# Patient Record
Sex: Male | Born: 1975 | Race: White | Hispanic: No | Marital: Single | Smoking: Current every day smoker
Health system: Southern US, Community
[De-identification: ages and names within clinical notes are randomized; demographics above are authoritative.]

## PROBLEM LIST (undated history)

## (undated) DIAGNOSIS — E785 Hyperlipidemia, unspecified: Secondary | ICD-10-CM

## (undated) DIAGNOSIS — M549 Dorsalgia, unspecified: Secondary | ICD-10-CM

## (undated) DIAGNOSIS — M5430 Sciatica, unspecified side: Secondary | ICD-10-CM

## (undated) DIAGNOSIS — E119 Type 2 diabetes mellitus without complications: Secondary | ICD-10-CM

## (undated) DIAGNOSIS — M1990 Unspecified osteoarthritis, unspecified site: Secondary | ICD-10-CM

## (undated) DIAGNOSIS — R12 Heartburn: Secondary | ICD-10-CM

## (undated) HISTORY — PX: KNEE SURGERY: SHX244

## (undated) HISTORY — PX: FOOT SURGERY: SHX648

## (undated) HISTORY — PX: BACK SURGERY: SHX140

## (undated) HISTORY — PX: CHOLECYSTECTOMY: SHX55

---

## 2000-01-10 ENCOUNTER — Emergency Department (HOSPITAL_COMMUNITY): Admission: EM | Admit: 2000-01-10 | Discharge: 2000-01-10 | Payer: Self-pay | Admitting: Emergency Medicine

## 2000-09-21 ENCOUNTER — Encounter: Admission: RE | Admit: 2000-09-21 | Discharge: 2000-09-21 | Payer: Self-pay | Admitting: Family Medicine

## 2000-09-21 ENCOUNTER — Encounter: Payer: Self-pay | Admitting: Family Medicine

## 2001-01-04 ENCOUNTER — Emergency Department (HOSPITAL_COMMUNITY): Admission: EM | Admit: 2001-01-04 | Discharge: 2001-01-04 | Payer: Self-pay | Admitting: Emergency Medicine

## 2001-10-06 ENCOUNTER — Encounter: Admission: RE | Admit: 2001-10-06 | Discharge: 2001-11-01 | Payer: Self-pay | Admitting: Podiatry

## 2001-11-26 ENCOUNTER — Ambulatory Visit (HOSPITAL_COMMUNITY): Admission: RE | Admit: 2001-11-26 | Discharge: 2001-11-26 | Payer: Self-pay | Admitting: Orthopedic Surgery

## 2001-11-26 ENCOUNTER — Encounter: Payer: Self-pay | Admitting: Orthopedic Surgery

## 2001-12-21 ENCOUNTER — Encounter: Payer: Self-pay | Admitting: Orthopedic Surgery

## 2001-12-22 ENCOUNTER — Inpatient Hospital Stay (HOSPITAL_COMMUNITY): Admission: EM | Admit: 2001-12-22 | Discharge: 2001-12-23 | Payer: Self-pay | Admitting: Orthopedic Surgery

## 2002-03-16 ENCOUNTER — Emergency Department (HOSPITAL_COMMUNITY): Admission: EM | Admit: 2002-03-16 | Discharge: 2002-03-16 | Payer: Self-pay | Admitting: Emergency Medicine

## 2002-04-02 ENCOUNTER — Encounter: Payer: Self-pay | Admitting: Emergency Medicine

## 2002-04-02 ENCOUNTER — Emergency Department (HOSPITAL_COMMUNITY): Admission: EM | Admit: 2002-04-02 | Discharge: 2002-04-02 | Payer: Self-pay | Admitting: Emergency Medicine

## 2002-09-23 ENCOUNTER — Encounter: Payer: Self-pay | Admitting: Emergency Medicine

## 2002-09-23 ENCOUNTER — Emergency Department (HOSPITAL_COMMUNITY): Admission: EM | Admit: 2002-09-23 | Discharge: 2002-09-23 | Payer: Self-pay | Admitting: Emergency Medicine

## 2002-11-24 ENCOUNTER — Encounter: Payer: Self-pay | Admitting: Gastroenterology

## 2002-11-24 ENCOUNTER — Encounter: Admission: RE | Admit: 2002-11-24 | Discharge: 2002-11-24 | Payer: Self-pay | Admitting: Gastroenterology

## 2002-12-14 ENCOUNTER — Ambulatory Visit (HOSPITAL_COMMUNITY): Admission: RE | Admit: 2002-12-14 | Discharge: 2002-12-14 | Payer: Self-pay | Admitting: Gastroenterology

## 2003-10-12 ENCOUNTER — Emergency Department (HOSPITAL_COMMUNITY): Admission: EM | Admit: 2003-10-12 | Discharge: 2003-10-13 | Payer: Self-pay | Admitting: Emergency Medicine

## 2004-10-30 ENCOUNTER — Ambulatory Visit: Payer: Self-pay | Admitting: Infectious Diseases

## 2004-10-30 ENCOUNTER — Inpatient Hospital Stay (HOSPITAL_COMMUNITY): Admission: RE | Admit: 2004-10-30 | Discharge: 2004-11-01 | Payer: Self-pay | Admitting: Orthopedic Surgery

## 2004-11-15 ENCOUNTER — Ambulatory Visit: Payer: Self-pay | Admitting: Infectious Diseases

## 2004-11-27 ENCOUNTER — Ambulatory Visit: Payer: Self-pay | Admitting: Infectious Diseases

## 2005-01-01 ENCOUNTER — Ambulatory Visit: Payer: Self-pay | Admitting: Infectious Diseases

## 2005-06-09 ENCOUNTER — Encounter: Admission: RE | Admit: 2005-06-09 | Discharge: 2005-06-09 | Payer: Self-pay | Admitting: Family Medicine

## 2007-12-15 ENCOUNTER — Ambulatory Visit (HOSPITAL_COMMUNITY): Admission: RE | Admit: 2007-12-15 | Discharge: 2007-12-15 | Payer: Self-pay | Admitting: Specialist

## 2008-05-26 ENCOUNTER — Emergency Department (HOSPITAL_COMMUNITY): Admission: EM | Admit: 2008-05-26 | Discharge: 2008-05-26 | Payer: Self-pay | Admitting: Family Medicine

## 2008-05-31 ENCOUNTER — Encounter (INDEPENDENT_AMBULATORY_CARE_PROVIDER_SITE_OTHER): Payer: Self-pay | Admitting: General Surgery

## 2008-05-31 ENCOUNTER — Ambulatory Visit (HOSPITAL_COMMUNITY): Admission: RE | Admit: 2008-05-31 | Discharge: 2008-05-31 | Payer: Self-pay | Admitting: General Surgery

## 2008-11-30 ENCOUNTER — Ambulatory Visit: Payer: Self-pay | Admitting: Family Medicine

## 2008-11-30 DIAGNOSIS — J329 Chronic sinusitis, unspecified: Secondary | ICD-10-CM | POA: Insufficient documentation

## 2008-12-05 ENCOUNTER — Ambulatory Visit: Payer: Self-pay | Admitting: Family Medicine

## 2008-12-05 LAB — CONVERTED CEMR LAB
ALT: 35 units/L (ref 0–53)
Albumin: 3.6 g/dL (ref 3.5–5.2)
Basophils Relative: 0.2 % (ref 0.0–3.0)
Bilirubin Urine: NEGATIVE
Chloride: 110 meq/L (ref 96–112)
Creatinine, Ser: 0.9 mg/dL (ref 0.4–1.5)
Direct LDL: 152.8 mg/dL
GFR calc non Af Amer: 103.2 mL/min (ref >60)
HDL: 33.8 mg/dL — ABNORMAL LOW (ref >39.00)
Ketones, urine, test strip: NEGATIVE
MCHC: 35.2 g/dL (ref 30.0–36.0)
MCV: 96.8 fL (ref 78.0–100.0)
Monocytes Absolute: 0.5 10*3/uL (ref 0.1–1.0)
Monocytes Relative: 8.7 % (ref 3.0–12.0)
Neutrophils Relative %: 47.1 % (ref 43.0–77.0)
Platelets: 270 10*3/uL (ref 150.0–400.0)
Protein, U semiquant: NEGATIVE
RBC: 4.17 M/uL — ABNORMAL LOW (ref 4.22–5.81)
Sodium: 143 meq/L (ref 135–145)
Triglycerides: 133 mg/dL (ref 0.0–149.0)
VLDL: 26.6 mg/dL (ref 0.0–40.0)

## 2008-12-14 ENCOUNTER — Ambulatory Visit: Payer: Self-pay | Admitting: Family Medicine

## 2009-01-16 ENCOUNTER — Emergency Department (HOSPITAL_COMMUNITY): Admission: EM | Admit: 2009-01-16 | Discharge: 2009-01-16 | Payer: Self-pay | Admitting: Family Medicine

## 2009-05-29 ENCOUNTER — Encounter: Admission: RE | Admit: 2009-05-29 | Discharge: 2009-05-29 | Payer: Self-pay | Admitting: Orthopedic Surgery

## 2009-06-08 ENCOUNTER — Observation Stay (HOSPITAL_COMMUNITY): Admission: RE | Admit: 2009-06-08 | Discharge: 2009-06-10 | Payer: Self-pay | Admitting: Orthopedic Surgery

## 2010-01-06 ENCOUNTER — Emergency Department (HOSPITAL_COMMUNITY): Admission: EM | Admit: 2010-01-06 | Discharge: 2010-01-07 | Payer: Self-pay | Admitting: Emergency Medicine

## 2010-01-10 ENCOUNTER — Emergency Department (HOSPITAL_COMMUNITY): Admission: EM | Admit: 2010-01-10 | Discharge: 2010-01-10 | Payer: Self-pay | Admitting: Emergency Medicine

## 2010-08-02 ENCOUNTER — Ambulatory Visit
Admission: RE | Admit: 2010-08-02 | Discharge: 2010-08-02 | Payer: Self-pay | Source: Home / Self Care | Attending: Occupational Medicine | Admitting: Occupational Medicine

## 2010-10-30 LAB — CBC
HCT: 44 % (ref 39.0–52.0)
Platelets: 283 10*3/uL (ref 150–400)
RBC: 4.55 MIL/uL (ref 4.22–5.81)
RDW: 11.9 % (ref 11.5–15.5)
WBC: 6.8 10*3/uL (ref 4.0–10.5)

## 2010-10-30 LAB — URINALYSIS, ROUTINE W REFLEX MICROSCOPIC
Bilirubin Urine: NEGATIVE
Protein, ur: NEGATIVE mg/dL
Specific Gravity, Urine: 1.024 (ref 1.005–1.030)

## 2010-10-30 LAB — DIFFERENTIAL
Basophils Absolute: 0 10*3/uL (ref 0.0–0.1)
Eosinophils Relative: 4 % (ref 0–5)
Lymphocytes Relative: 42 % (ref 12–46)
Lymphs Abs: 2.8 10*3/uL (ref 0.7–4.0)
Neutro Abs: 3.1 10*3/uL (ref 1.7–7.7)
Neutrophils Relative %: 47 % (ref 43–77)

## 2010-10-30 LAB — COMPREHENSIVE METABOLIC PANEL
ALT: 28 U/L (ref 0–53)
BUN: 10 mg/dL (ref 6–23)
CO2: 32 mEq/L (ref 19–32)
Chloride: 103 mEq/L (ref 96–112)
Creatinine, Ser: 0.8 mg/dL (ref 0.4–1.5)
GFR calc non Af Amer: >60 mL/min (ref >60)
Glucose, Bld: 107 mg/dL — ABNORMAL HIGH (ref 70–99)
Total Protein: 7 g/dL (ref 6.0–8.3)

## 2010-10-30 LAB — PROTIME-INR: INR: 0.9 (ref 0.00–1.49)

## 2010-12-10 NOTE — Op Note (Signed)
[name], [name]              ACCOUNT NO.:  [id number]   MEDICAL RECORD NO.:  [id number]          PATIENT TYPE:  AMB   LOCATION:  DAY                          FACILITY:  [facility]   PHYSICIAN:  Anselm Pancoast. Weatherly, M.D.DATE OF BIRTH:  November 11, 1975   DATE OF PROCEDURE:  05/31/2008  DATE OF DISCHARGE:                               OPERATIVE REPORT   PREOPERATIVE DIAGNOSIS:  Acute cholecystitis.   POSTOPERATIVE DIAGNOSIS:  Acute cholecystitis.   OPERATION:  Laparoscopic cholecystectomy with cholangiogram.   SURGEON:  Anselm Pancoast. Zachery Dakins, M.D.   ASSISTANT:  Leonie Man, M.D.   HISTORY:  Keith [name] is a 35 year old Caucasian male who I saw in  the office yesterday with the following history.  The patient states for  about a week he has been having upper abdominal pain.  He had not had  this until this time he went to the emergency room, I think it was at  [facility], over the weekend; they found that he had a 2-cm stone in  his gallbladder, and he was not that acutely ill at the time.  It was  suggested that see Dr. Freida Busman, who he had seen several months ago with a  colorectal cyst.  The next appointment was in approximately 3 weeks.  The patient's symptoms persisted and he was seen in the urgent office  yesterday.  He was definitely kind of mildly tender in the upper  abdomen.  He was otherwise in good health and I recommended we go ahead  and add him onto the OR schedule for a laparoscopic cholecystectomy with  cholangiogram.  His white count was repeated and it was 7500 with a  hematocrit of 48.  His liver function studies were essentially normal  (SGOT of 46 and SGPT of 88).   The patient preoperatively was given 3 grams of Ancef.  He had PAS  stockings and was taken to the operative suite.  Induction of general  anesthesia and an oral tube to the stomach, and then the abdomen was  prepped with Betadine solution and then draped in a sterile manner.  The  patient was  kind of pudgy but not extremely overweight.  He was quite  muscular.  I made a little incision below the umbilicus.  The fascia was  identified, picked up between two Kochers and then a small opening made;  the underlying peritoneum identified and a hemostat or a Tresa Endo was  placed carefully through it.  The Hasson cannula was introduced after a  pursestring of 0 Vicryl, and the upper 10-mm trocar was placed under the  direct vision to the right of the falciforme ligament.  The gallbladder  was swollen and mildly inflamed; covered with a lot of kind of chronic  adhesions.  The 2 lateral 5-mm trocars were placed after anesthetizing  the fascia, and then the gallbladder was retraced upward and outward.  The adhesions were kind of stripped down carefully from the fairly  distended gallbladder.  The cystic artery entered high up on the  gallbladder; but, since I could not actually see the cystic duct  junction, I  did not divide anything until I teased it on out and  identified the proximal portion of the gallbladder-cystic duct junction.  I then put a clip across the cystic duct; but did put 2 clips actually  on the artery, but did not divide it.  I then put a little incision just  proximal.  I put in a Cook catheter and held it in place with a clip.  An x-ray was obtained that showed a very thin, normal extrahepatic  biliary system.  Good flow went into the intrahepatic radicals and good  flow into the duodenum.  The catheter was removed, the cystic duct was  triply clipped and divided.  I then put a clip distally to the  2 and divided the artery.  Then the gallbladder was freed from its bed  with the hook electrocautery.  Good hemostasis was obtained.  There was  a little area, that may have been a posterior branch of the cystic  artery,  that was clipped laterally.  Then the gallbladder was placed in  the EndoCatch bag.  Inspection of the gallbladder fossa was done, and  everything looked  good.  The irrigating fluid was aspirated and then the  camera was switched to the upper 10-mm port.  The bag containing the  gallbladder was retracted; it was necessary to open the fascia just a  wee bit over a hemostat, because of the large stone.  I then I put an  additional figure-of-eight suture of 0 Vicryl in the fascia and tied  both again at the pursestring.  The fascia was then anesthetized.  We  were looking at it directly with the laparoscopic during this fascia  closure.  Then the little remaining irrigating fluid was removed and the  CO2 released.  Then the upper 10-mm trocar and the 5-mm ports were  withdrawn under direct vision.   The patient tolerated the procedure nicely and was sent to the recovery  room in stable postoperative condition.  The patient lives alone and  wants to spend the night and go home in the morning; and that should no  problem.           ______________________________  Anselm Pancoast. Zachery Dakins, M.D.     WJW/MEDQ  D:  05/31/2008  T:  06/01/2008  Job:  [id number]

## 2010-12-13 NOTE — H&P (Signed)
[facility]  Patient:    Keith [name], Keith [name] Visit Number: [id number] MRN: [id number]          Service Type: SUR Location: 4W 0447 01 Attending Physician:  Skip Mayer Dictated by:   Druscilla Brownie Shela Nevin, P.A. Admit Date:  12/21/2001   CC:         Desma Maxim, M.D.   History and Physical  DATE OF BIRTH:  [birth year]  CHIEF COMPLAINT:  "Pain in my back and left leg."  HISTORY OF PRESENT ILLNESS:  This is a 35 year old white male who has had ongoing problems concerning his back with radiation to the left lower extremity.  He has had back pain now and he has sought chiropractic treatment. Unfortunately, he was not given any relief with his chiropractic treatment and his pain continued.  This recent pain has been going on for at least three months.  He underwent an MRI which showed an L5-S1 left paracentral disk extrusion with disk abutting the left S4 nerve roots centrally.  Due to the persistent findings and positive straight leg raise and antalgic gait from his back pain, it was felt that he would benefit from surgical intervention.  He is being admitted for microdiskectomy at L5-S1.  PAST MEDICAL HISTORY:  This young man has been excellent health throughout his lifetime.  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  In December of 2002, he had podiatric surgery on his left foot for "flat feet" and actually has had continuing discomfort in that area.  Desma Maxim, M.D., is his medical physician.  MEDICATIONS:  He takes no medications.  SOCIAL HISTORY:  The patient is unmarried.  He works at [employer], Teaching laboratory technician and receiving deliveries.  He smokes about a pack of cigarettes per day and has one or two beers per day.  He lives with his grandmother in a one-level house.  FAMILY HISTORY:  Noncontributory.  REVIEW OF SYSTEMS:  CNS:  No seizure  disorder, paralysis, numbness, or double vision.  He does have radicular pain consistent with L5-S1 nerve root impingement on the left.  Cardiovascular:  No chest pain, no angina, and no orthopnea.  Respiratory:  No productive cough, no hemoptysis, and no shortness of breath.  Gastrointestinal:  No nausea, vomiting, melena, or bloody stool. Genitourinary:  No discharge, dysuria, or hematuria.  Musculoskeletal: Primarily as in the present illness.  PHYSICAL EXAMINATION:  An alert, cooperative, and friendly, 35 year old, white male.  VITAL SIGNS:  Blood pressure 110/70, pulse 80, respirations 12.  HEENT:  Normocephalic and atraumatic.  PERRLA.  EOM intact.  The oropharynx is clear.  CHEST:  Clear to auscultation.  No rhonchi or rales.  HEART:  Regular rate and rhythm.  No murmurs are heard.  ABDOMEN:  Obese and nontender.  Liver and spleen not felt.  GENITALIA:  Not done, not pertinent to present illness.  RECTAL:  Not done, not pertinent to present illness.  EXTREMITIES:  Positive straight leg raising on the left with radiation into the left leg.  ADMISSION DIAGNOSIS:  Herniated nucleus pulposus at L5-S1 on the left.  PLAN:  The patient will undergo microdiskectomy at L5-S1 on the left.  He states that he does not want a PCA pump postoperatively.  He would prefer p.o. analgesics.  We will, of course, give him IM medications for pain should he need it. Dictated by:   Druscilla Brownie. Shela Nevin, P.A. Attending  Physician:  Skip Mayer DD:  12/16/01 TD:  12/17/01 Job: [id number]

## 2010-12-13 NOTE — Op Note (Signed)
[name], [name] NO.:  [id number]   MEDICAL RECORD NO.:  [id number]          PATIENT TYPE:  INP   LOCATION:  0004                         FACILITY:  [facility]   PHYSICIAN:  Georges Lynch. 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   POSTOPERATIVE DIAGNOSIS:  Infected bone graft performed by a podiatrist  three years ago, dorsum of left foot.   OPERATION:  Incision and drainage and removal of bone graft left foot,  dorsal aspect.   SURGEON:  Georges Lynch. Darrelyn Hillock, M.D.   ASSISTANT:  Ebbie Ridge. Paitsel, P.A.   DESCRIPTION OF PROCEDURE:  Under general anesthesia, routine orthopedic  prepping and draping of the left foot was carried out.  No antibiotics were  given until we took the culture.  Following the opening of the wound, we had  some purulent material that was cultured.  There was a small piece of bone  graft material which was subcutaneous. We removed that.  We went down and  thoroughly debrided the severe synovial reaction over the dorsum of his  foot.  We debrided all that synovium.  There was a severe granulation type  tissue present.  We went down into the joint and removed another piece of  bone graft that was actually extruded out of the joint so there were two  total bone graft materials removed.  These two showed up on his x-ray as  well.  We thoroughly debrided out the joint and irrigated the area and  loosely approximated portion of the wound and a drain was inserted.  Sterile  dressings were applied.  Following the cultures for sensitivities for  aerobic and anaerobic, we put the patient on Ancef IV.      RAG/MEDQ  D:  10/30/2004  T:  10/30/2004  Job:  [id number]

## 2010-12-13 NOTE — Op Note (Signed)
   NAME:  NAS, WAFER                        ACCOUNT NO.:  [id number]   MEDICAL RECORD NO.:  [id number]                   PATIENT TYPE:  AMB   LOCATION:  ENDO                                 FACILITY:  [facility]   PHYSICIAN:  Anselmo Rod, M.D.               DATE OF BIRTH:  07-19-1976   DATE OF PROCEDURE:  12/14/2002  DATE OF DISCHARGE:                                 OPERATIVE REPORT   PROCEDURE:  Colonoscopy.   ENDOSCOPIST:  Charna Elizabeth, M.D.   INSTRUMENT USED:  Olympus video colonoscope.   INDICATIONS FOR PROCEDURE:  [age]-year-old white male with a history  of loose watery bowel with no weight loss and right lower quadrant pain.  Rule out IBD.   PROCEDURE PERFORMED:  Informed consent was procured from the patient.  The  patient fasted for eight hours prior to the procedure and prepped with a  bottle of magnesium citrate and a gallon of GOLYTELY the night prior to the  procedure.   PREPROCEDURE PHYSICAL EXAMINATION:  VITAL SIGNS:  The patient had stable  vital signs.  NECK:  Supple.  CHEST:  Clear to auscultation.  HEART:  S1 and S2 regular.  ABDOMEN:  Soft with normal bowel sounds.   DESCRIPTION OF PROCEDURE:  The patient was placed in the left lateral  decubitus position, sedated with 100 mg of Demerol and 10 mg of Versed  intravenously.  Once the patient was adequately sedated and maintained on  low flow oxygen, continuous cardiac monitoring, the Olympus video  colonoscope was advanced from the rectum to the cecum with difficulty.  There was a large amount of residual stool in the colon, especially on the  right side.  Multiple washings were done.  No masses, polyps, erosions,  ulcerations, or diverticula were seen.  The terminal ileum was not  visualized in spite of several efforts to do so.  The patient tolerated the  procedure well without complications.  Retroflexed view revealed no  abnormalities.   IMPRESSION:  Normal colonoscopy.   RECOMMENDATIONS:  1. Upper GI with small bowel follow through will be planned for the patient     with further recommendations made     on followup.  2. Avoidance of all nonsteroidals is encouraged.  3. Liberal fluid intake along with a high fiber diet.                                               Anselmo Rod, M.D.    JNM/MEDQ  D:  12/14/2002  T:  12/15/2002  Job:  [id number]   cc:   Donia Guiles, M.D.  [address] [place]  [place] [address]  Fax: [phone]

## 2010-12-13 NOTE — Op Note (Signed)
[facility]  Patient:    AKIL, HOOS Visit Number: [id number] MRN: [id number]          Service Type: SUR Location: 4W 0447 01 Attending Physician:  Skip Mayer Dictated by:   Georges Lynch Darrelyn Hillock, M.D. Proc. Date: 12/21/01 Admit Date:  12/21/2001                             Operative Report  SURGEON:  Windy Fast A. Darrelyn Hillock, M.D.  ASSISTANT:  Javier Docker, M.D.  PREOPERATIVE DIAGNOSIS:  Extremely large herniated disk at L5-S1 on the left.  POSTOPERATIVE DIAGNOSIS:  Extremely large herniated disk at L5-S1 on the left.  OPERATION PERFORMED:  Hemilaminectomy and microdiskectomy at L5-S1 on the left.  DESCRIPTION OF PROCEDURE:  Under general anesthesia, routine orthopedic prep and draping of the lower back was carried out. The patient had 1 gm of IV Ancef. He was placed on the spinal frame. After sterile prep and draping was carried out, two needles were placed in the back for localization purposes. Once I localized L5-S1, an incision was made over the L5-S1 interspace, bleeders identified and cauterized. The incision was carried down to the L5-S1 interspace and another x-ray was taken with an instrument in place. Once we verified we were at the sacrum and at the L5-S1 area, we then carried out our limited hemilaminectomy in the usual fashion. We did a limited hemilaminectomy on the basis of the large disk space. Because of the large interlaminar space. We then removed the ligamentum flavum and we were able to easily identify the S1 root and the dura. The gently retracted that and the nerve root was quite swollen. Immediately upon retracting the nerve root, we noted an extremely large herniated disk. Literally it was rupturing out of the posterior longitudinal ligament. A small incision was made in the posterior longitudinal ligament and we completed the diskectomy, a large amount of disk fragment was removed. We went up under the  root subligamentous as well and we went out laterally as well and removed all the free disk material and then went down in the space and removed the remaining loose material. Once this was done, we were able to easily pass the hockey stick under the dura and the nerve root down the foramen. We went into the axillary region of the root and made sure there was no free fragment there and there was not. We thoroughly irrigated out the area and we had an excellent decompression of the nerve root. I then loosely applied one piece of thrombin soaked Gelfoam over the area, closed the wound in layers in the usual fashion. I left a small portion of the inferior aspect of the wound open for drainage purposes in case he had any unusual bleeding. The skin was closed with metal staples, sterile Neosporin dressing was applied. The patient left the operating room in satisfactory condition. Dictated by:   Georges Lynch Darrelyn Hillock, M.D. Attending Physician:  Skip Mayer DD:  12/21/01 TD:  12/22/01 Job: [phone]

## 2011-02-20 ENCOUNTER — Emergency Department (HOSPITAL_COMMUNITY)
Admission: EM | Admit: 2011-02-20 | Discharge: 2011-02-20 | Disposition: A | Discharge status: Home / Self Care | Payer: BC Managed Care – PPO | Attending: Emergency Medicine | Admitting: Emergency Medicine

## 2011-02-20 DIAGNOSIS — M7989 Other specified soft tissue disorders: Secondary | ICD-10-CM | POA: Insufficient documentation

## 2011-02-20 DIAGNOSIS — M25569 Pain in unspecified knee: Secondary | ICD-10-CM | POA: Insufficient documentation

## 2011-02-20 DIAGNOSIS — M76899 Other specified enthesopathies of unspecified lower limb, excluding foot: Secondary | ICD-10-CM | POA: Insufficient documentation

## 2011-03-03 ENCOUNTER — Encounter (HOSPITAL_COMMUNITY): Payer: BC Managed Care – PPO

## 2011-03-03 ENCOUNTER — Other Ambulatory Visit: Payer: Self-pay | Admitting: Specialist

## 2011-03-03 ENCOUNTER — Other Ambulatory Visit: Payer: Self-pay | Admitting: Obstetrics and Gynecology

## 2011-03-03 LAB — URINALYSIS, ROUTINE W REFLEX MICROSCOPIC
Hgb urine dipstick: NEGATIVE
Leukocytes, UA: NEGATIVE
Specific Gravity, Urine: 1.018 (ref 1.005–1.030)
Urobilinogen, UA: 0.2 mg/dL (ref 0.0–1.0)
pH: 7.5 (ref 5.0–8.0)

## 2011-03-03 LAB — COMPREHENSIVE METABOLIC PANEL
ALT: 30 U/L (ref 0–53)
AST: 17 U/L (ref 0–37)
CO2: 28 mEq/L (ref 19–32)
Chloride: 104 mEq/L (ref 96–112)
Creatinine, Ser: 0.82 mg/dL (ref 0.50–1.35)
GFR calc Af Amer: >60 mL/min (ref >60)
GFR calc non Af Amer: >60 mL/min (ref >60)
Total Bilirubin: 0.3 mg/dL (ref 0.3–1.2)
Total Protein: 7.2 g/dL (ref 6.0–8.3)

## 2011-03-03 LAB — DIFFERENTIAL
Basophils Relative: 0 % (ref 0–1)
Eosinophils Absolute: 0.3 10*3/uL (ref 0.0–0.7)
Monocytes Absolute: 0.5 10*3/uL (ref 0.1–1.0)
Neutrophils Relative %: 55 % (ref 43–77)

## 2011-03-03 LAB — CBC
Hemoglobin: 14.2 g/dL (ref 13.0–17.0)
MCV: 95.7 fL (ref 78.0–100.0)
Platelets: 338 10*3/uL (ref 150–400)
RBC: 4.45 MIL/uL (ref 4.22–5.81)
WBC: 8.2 10*3/uL (ref 4.0–10.5)

## 2011-03-03 LAB — ABO/RH: ABO/RH(D): O POS

## 2011-03-03 LAB — CROSSMATCH

## 2011-03-03 LAB — SURGICAL PCR SCREEN: Staphylococcus aureus: NEGATIVE

## 2011-03-04 ENCOUNTER — Ambulatory Visit (HOSPITAL_COMMUNITY)
Admission: RE | Admit: 2011-03-04 | Discharge: 2011-03-04 | Disposition: A | Discharge status: Home / Self Care | Payer: BC Managed Care – PPO | Source: Ambulatory Visit | Attending: Specialist | Admitting: Specialist

## 2011-03-04 ENCOUNTER — Other Ambulatory Visit: Payer: Self-pay | Admitting: Specialist

## 2011-03-04 DIAGNOSIS — F17200 Nicotine dependence, unspecified, uncomplicated: Secondary | ICD-10-CM | POA: Insufficient documentation

## 2011-03-04 DIAGNOSIS — Z01812 Encounter for preprocedural laboratory examination: Secondary | ICD-10-CM | POA: Insufficient documentation

## 2011-03-04 DIAGNOSIS — M76899 Other specified enthesopathies of unspecified lower limb, excluding foot: Secondary | ICD-10-CM | POA: Insufficient documentation

## 2011-03-04 DIAGNOSIS — M7040 Prepatellar bursitis, unspecified knee: Secondary | ICD-10-CM | POA: Insufficient documentation

## 2011-03-10 NOTE — Op Note (Signed)
  NAMEKEEVAN, WOLZ NO.:  [id number]  MEDICAL RECORD NO.:  [id number]  LOCATION:  PADM                         FACILITY:  [facility]  PHYSICIAN:  Erasmo Leventhal, M.D.DATE OF BIRTH:  [birth year]  DATE OF PROCEDURE:  03/04/2011 DATE OF DISCHARGE:                              OPERATIVE REPORT   PREOPERATIVE DIAGNOSIS:  Right knee chronic prepatellar bursitis.  POSTOPERATIVE DIAGNOSIS:  Right knee chronic prepatellar bursitis.  PROCEDURE:  Right knee excisional biopsy, prepatellar bursa, mass.  SURGEON:  Erasmo Leventhal, M.D.  ASSISTANT:  Jamelle Rushing, P.A-C.  ANESTHESIA:  Femoral nerve block with general.  BLOOD LOSS:  Less than 10 mL.  DRAINS:  None.  COMPLICATIONS:  None.  TOURNIQUET TIME:  35 minutes, 250 mmHg.  COMPLICATIONS:  None.  DISPOSITION:  PACU, stable.  OPERATIVE FINDINGS:  Very firm, thick-walled cyst.  Benign appearance clinically.  Opening had confirmed cystic nature and evidence what looked like pseudogout crystals.  No satellite lesions removed in its entirety.  Pathology report is pending.  OPERATIVE DETAILS:  The patient was counseled in the holding area and reexamined, no lymphadenopathy present, benign appearance clinically to the prepatellar region.  A femoral nerve block was administered per anesthesiologist.  IV was started, antibiotics given on the way to operating room.  TED hose was placed on the unaffected leg.  In the operating room, placed under general anesthesia.  All extremities were well padded and bumped.  Right lower extremity elevated, was prepped with DuraPrep in a sterile fashion.  Standard time-out was done and confirmed correct side, procedure, and etc.  Next, an Esmarch tourniquet was insufflated to 250 mmHg.  Straight midline incision was made in the skin and subcutaneous tissue.  Dissection was carried down to the mass, which was sharply excised.  Medial, lateral, distal, proximal and  then removed as a single mass.  There were no evidence of surrounding satellite lesions and it was a very thick walled cyst, had a benign appearance clinically.  It was opened, confirmed cystic nature of this and lots of fluid inside, and also what appeared be gouty crystals. 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  Wounds were copiously irrigated.  The knee joint was not violated.  Extensor mechanism was not violated.  Meticulous hemostasis was obtained.  Layered closure was done deep of Vicryl subcu, skin with sub Monocryl suture.  Steri-Strips were applied.  Sterile compressive dressing applied, compressive wrap, TED hose, ice pack, and knee immobilizer for full extension.  He tolerated procedure with no complications or problems.  He was awakened from general anesthesia and taken to the PACU in stable condition.  Discharged to home and follow up in the office.          ______________________________ Erasmo Leventhal, M.D.     RAC/MEDQ  D:  03/04/2011  T:  03/05/2011  Job:  [id number]  Electronically Signed by Eugenia Mcalpine M.D. on 03/10/2011 03:38:58 PM

## 2011-04-28 LAB — CBC
HCT: 45.7
Hemoglobin: 15.6
MCV: 97.3
RBC: 4.7
WBC: 10.1

## 2011-04-28 LAB — COMPREHENSIVE METABOLIC PANEL
Alkaline Phosphatase: 57
BUN: 8
CO2: 25
Chloride: 106
Creatinine, Ser: 0.87
GFR calc non Af Amer: >60
Glucose, Bld: 84
Potassium: 4.4
Total Bilirubin: 1.2

## 2011-04-28 LAB — LIPASE, BLOOD: Lipase: 31

## 2011-04-28 LAB — DIFFERENTIAL
Basophils Absolute: 0
Basophils Relative: 0
Lymphocytes Relative: 27
Neutro Abs: 6.5

## 2011-04-29 LAB — COMPREHENSIVE METABOLIC PANEL
ALT: 88 — ABNORMAL HIGH
AST: 46 — ABNORMAL HIGH
Alkaline Phosphatase: 56
Calcium: 9.8
GFR calc Af Amer: >60
Glucose, Bld: 106 — ABNORMAL HIGH
Potassium: 4.3
Sodium: 136
Total Protein: 7.3

## 2011-04-29 LAB — DIFFERENTIAL
Basophils Relative: 1
Eosinophils Absolute: 0.3
Eosinophils Relative: 4
Lymphs Abs: 2.6
Monocytes Absolute: 0.5
Monocytes Relative: 7
Neutrophils Relative %: 54

## 2011-04-29 LAB — CBC
Hemoglobin: 16.4
MCHC: 33.8
RBC: 4.96
RDW: 12.7

## 2011-09-22 ENCOUNTER — Encounter (HOSPITAL_COMMUNITY): Payer: Self-pay | Admitting: *Deleted

## 2011-09-22 ENCOUNTER — Emergency Department (INDEPENDENT_AMBULATORY_CARE_PROVIDER_SITE_OTHER)
Admission: EM | Admit: 2011-09-22 | Discharge: 2011-09-22 | Disposition: A | Discharge status: Home / Self Care | Payer: BC Managed Care – PPO | Source: Home / Self Care | Attending: Family Medicine | Admitting: Family Medicine

## 2011-09-22 DIAGNOSIS — A084 Viral intestinal infection, unspecified: Secondary | ICD-10-CM

## 2011-09-22 DIAGNOSIS — A09 Infectious gastroenteritis and colitis, unspecified: Secondary | ICD-10-CM

## 2011-09-22 DIAGNOSIS — J45901 Unspecified asthma with (acute) exacerbation: Secondary | ICD-10-CM

## 2011-09-22 DIAGNOSIS — J45909 Unspecified asthma, uncomplicated: Secondary | ICD-10-CM

## 2011-09-22 MED ORDER — ALBUTEROL SULFATE HFA 108 (90 BASE) MCG/ACT IN AERS: 2.0000 | INHALATION_SPRAY | Freq: Four times a day (QID) | RESPIRATORY_TRACT | Status: DC | PRN

## 2011-09-22 MED ORDER — DOXYCYCLINE HYCLATE 100 MG PO CAPS: 100.0000 mg | ORAL_CAPSULE | Freq: Two times a day (BID) | ORAL | Status: DC

## 2011-09-22 MED ORDER — METHYLPREDNISOLONE ACETATE 40 MG/ML IJ SUSP
80.0000 mg | Freq: Once | INTRAMUSCULAR | Status: AC
  Administered 2011-09-22: 80 mg via INTRAMUSCULAR

## 2011-09-22 MED ORDER — TRIAMCINOLONE ACETONIDE 40 MG/ML IJ SUSP
INTRAMUSCULAR | Status: AC
  Filled 2011-09-22: qty 5

## 2011-09-22 MED ORDER — ALBUTEROL SULFATE (5 MG/ML) 0.5% IN NEBU
INHALATION_SOLUTION | RESPIRATORY_TRACT | Status: AC
  Filled 2011-09-22: qty 1

## 2011-09-22 MED ORDER — IPRATROPIUM BROMIDE 0.02 % IN SOLN
0.5000 mg | Freq: Once | RESPIRATORY_TRACT | Status: AC
  Administered 2011-09-22: 0.5 mg via RESPIRATORY_TRACT

## 2011-09-22 MED ORDER — GI COCKTAIL ~~LOC~~
30.0000 mL | Freq: Once | ORAL | Status: AC
  Administered 2011-09-22: 30 mL via ORAL

## 2011-09-22 MED ORDER — DOXYCYCLINE HYCLATE 100 MG PO CAPS: 100.0000 mg | ORAL_CAPSULE | Freq: Two times a day (BID) | ORAL | Status: AC

## 2011-09-22 MED ORDER — TRIAMCINOLONE ACETONIDE 40 MG/ML IJ SUSP
40.0000 mg | Freq: Once | INTRAMUSCULAR | Status: AC
  Administered 2011-09-22: 40 mg via INTRAMUSCULAR

## 2011-09-22 MED ORDER — ALBUTEROL SULFATE (5 MG/ML) 0.5% IN NEBU
5.0000 mg | INHALATION_SOLUTION | Freq: Once | RESPIRATORY_TRACT | Status: AC
  Administered 2011-09-22: 5 mg via RESPIRATORY_TRACT

## 2011-09-22 MED ORDER — ALBUTEROL SULFATE (5 MG/ML) 0.5% IN NEBU: 5.0000 mg | INHALATION_SOLUTION | Freq: Once | RESPIRATORY_TRACT | Status: DC

## 2011-09-22 MED ORDER — IPRATROPIUM BROMIDE 0.02 % IN SOLN: 0.5000 mg | Freq: Once | RESPIRATORY_TRACT | Status: DC

## 2011-09-22 MED ORDER — GI COCKTAIL ~~LOC~~: 30.0000 mL | Freq: Once | ORAL | Status: DC

## 2011-09-22 MED ORDER — GI COCKTAIL ~~LOC~~
ORAL | Status: AC
  Filled 2011-09-22: qty 30

## 2011-09-22 MED ORDER — METHYLPREDNISOLONE ACETATE 80 MG/ML IJ SUSP
INTRAMUSCULAR | Status: AC
  Filled 2011-09-22: qty 1

## 2011-09-22 NOTE — ED Notes (Signed)
C/O nasal and head congestion x 6 days; over past 48 hrs lower anterior ribs are very tender and painful when coughing.  Cough is non-productive.  Had fever last week, but resolved.  Started with diarrhea 2-3 days ago - has approx 4-5 episodes per day.  Denies n/v.  Has been taking Dayquil and Nyquil.

## 2011-09-22 NOTE — ED Notes (Signed)
Breathing treatment complete.  Reports feeling "a little bit" better.  Ronchi noted throughout.

## 2011-09-22 NOTE — ED Notes (Signed)
Expiratory wheezing noted upon auscultation.  Breathing treatment in progress.

## 2011-09-22 NOTE — ED Provider Notes (Cosign Needed)
History     CSN: [id number]  Arrival date & time 09/22/11  1810   First MD Initiated Contact with Patient 09/22/11 1839      Chief Complaint  Patient presents with  . Nasal Congestion  . Chest Pain  . Diarrhea    (Consider location/radiation/quality/duration/timing/severity/associated sxs/prior treatment) Patient is a 36 y.o. male presenting with cough. The history is provided by the patient.  Cough This is a new problem. The current episode started more than 2 days ago. The problem has been gradually worsening. The cough is non-productive. Associated symptoms include rhinorrhea, shortness of breath and wheezing. Associated symptoms comments: N/v last week with persistent diarrhea, no blood.Marland Kitchen He is a smoker.    History reviewed. No pertinent past medical history.  Past Surgical History  Procedure Date  . Knee surgery   . Back surgery   . Foot surgery     No family history on file.  History  Substance Use Topics  . Smoking status: Current Everyday Smoker -- 0.5 packs/day  . Smokeless tobacco: Not on file  . Alcohol Use: Yes     occasional      Review of Systems  Constitutional: Negative.   HENT: Positive for congestion, rhinorrhea and postnasal drip.   Respiratory: Positive for cough, shortness of breath and wheezing.   Gastrointestinal: Positive for nausea, vomiting and diarrhea. Negative for blood in stool.  Skin: Negative.     Allergies  Review of patient's allergies indicates no known allergies.  Home Medications   Current Outpatient Rx  Name Route Sig Dispense Refill  . ALBUTEROL SULFATE HFA 108 (90 BASE) MCG/ACT IN AERS Inhalation Inhale 2 puffs into the lungs every 6 (six) hours as needed for wheezing. 1 Inhaler 0  . DOXYCYCLINE HYCLATE 100 MG PO CAPS Oral Take 1 capsule (100 mg total) by mouth 2 (two) times daily. 20 capsule 0    BP 132/102  Pulse 80  Temp(Src) 98.2 F (36.8 C) (Oral)  Resp 18  SpO2 98%  Physical Exam  Nursing note and  vitals reviewed. Constitutional: He is oriented to person, place, and time. He appears well-developed and well-nourished.  HENT:  Right Ear: External ear normal.  Left Ear: External ear normal.  Nose: Nose normal.  Mouth/Throat: Oropharynx is clear and moist.  Neck: Normal range of motion. Neck supple.  Cardiovascular: Normal rate.   Pulmonary/Chest: Effort normal. He has wheezes.  Abdominal: Soft. Bowel sounds are normal. He exhibits no mass. There is tenderness. There is no rebound and no guarding.  Lymphadenopathy:    He has no cervical adenopathy.  Neurological: He is alert and oriented to person, place, and time.  Skin: Skin is warm and dry.  Psychiatric: He has a normal mood and affect.    ED Course  Procedures (including critical care time)  Labs Reviewed - No data to display No results found.   1. Gastroenteritis and colitis, viral   2. Acute asthmatic bronchitis       MDM   Lungs much improved but not clear after neb, sx improved.       Barkley Bruns, MD 09/22/11 [phone]

## 2011-09-27 ENCOUNTER — Emergency Department (HOSPITAL_COMMUNITY)
Admission: EM | Admit: 2011-09-27 | Discharge: 2011-09-27 | Disposition: A | Discharge status: Home / Self Care | Payer: BC Managed Care – PPO | Attending: Emergency Medicine | Admitting: Emergency Medicine

## 2011-09-27 ENCOUNTER — Encounter (HOSPITAL_COMMUNITY): Payer: Self-pay | Admitting: Emergency Medicine

## 2011-09-27 DIAGNOSIS — R51 Headache: Secondary | ICD-10-CM | POA: Insufficient documentation

## 2011-09-27 DIAGNOSIS — R062 Wheezing: Secondary | ICD-10-CM | POA: Insufficient documentation

## 2011-09-27 DIAGNOSIS — R5381 Other malaise: Secondary | ICD-10-CM | POA: Insufficient documentation

## 2011-09-27 DIAGNOSIS — R110 Nausea: Secondary | ICD-10-CM | POA: Insufficient documentation

## 2011-09-27 MED ORDER — PSEUDOEPHEDRINE HCL ER 120 MG PO TB12: 120.0000 mg | ORAL_TABLET | Freq: Two times a day (BID) | ORAL | Status: AC

## 2011-09-27 MED ORDER — HYDROCODONE-ACETAMINOPHEN 5-325 MG PO TABS: 1.0000 | ORAL_TABLET | Freq: Four times a day (QID) | ORAL | Status: AC | PRN

## 2011-09-27 MED ORDER — NAPROXEN 500 MG PO TABS: 500.0000 mg | ORAL_TABLET | Freq: Two times a day (BID) | ORAL | Status: AC

## 2011-09-27 NOTE — ED Notes (Signed)
Pt reports headache onset 0500. Pt took tylenol without relief.

## 2011-09-27 NOTE — ED Provider Notes (Signed)
History     CSN: [id number]  Arrival date & time 09/27/11  1046   First MD Initiated Contact with Patient 09/27/11 1109      Chief Complaint  Patient presents with  . Headache    (Consider location/radiation/quality/duration/timing/severity/associated sxs/prior treatment) Patient is a 36 y.o. male presenting with headaches. The history is provided by the patient.  Headache  This is a new problem. The current episode started 12 to 24 hours ago. The problem occurs constantly. The problem has not changed since onset.The headache is associated with nothing (lights do not affect it). The pain is located in the right unilateral region. The quality of the pain is described as sharp. The pain is moderate. The pain does not radiate. Associated symptoms include malaise/fatigue and nausea. Pertinent negatives include no fever, no syncope and no vomiting. He has tried acetaminophen for the symptoms. The treatment provided no relief.  Pt had a recent cold and had a headache but that resolved.  He is currently being treated for a bronchitis with albuterol and doxycycline. He was seen within this last week at an urgent care for this complaint. He woke up this am with the headache.  He cannot get comfortable.  No thunder clap onset.  No fevers.  NO rashes.    History reviewed. No pertinent past medical history.  Past Surgical History  Procedure Date  . Knee surgery   . Back surgery   . Foot surgery     History reviewed. No pertinent family history.  History  Substance Use Topics  . Smoking status: Current Everyday Smoker -- 0.5 packs/day  . Smokeless tobacco: Not on file  . Alcohol Use: Yes     occasional      Review of Systems  Constitutional: Positive for malaise/fatigue. Negative for fever.  Cardiovascular: Negative for syncope.  Gastrointestinal: Positive for nausea. Negative for vomiting.  Neurological: Positive for headaches.    Allergies  Review of patient's allergies indicates  no known allergies.  Home Medications   Current Outpatient Rx  Name Route Sig Dispense Refill  . ALBUTEROL SULFATE HFA 108 (90 BASE) MCG/ACT IN AERS Inhalation Inhale 2 puffs into the lungs every 6 (six) hours as needed for wheezing. 1 Inhaler 0  . DOXYCYCLINE HYCLATE 100 MG PO CAPS Oral Take 1 capsule (100 mg total) by mouth 2 (two) times daily. 20 capsule 0  . DOXYCYCLINE HYCLATE 100 MG PO CAPS Oral Take 1 capsule (100 mg total) by mouth 2 (two) times daily. 20 capsule 0  . DOXYCYCLINE HYCLATE 100 MG PO CAPS Oral Take 1 capsule (100 mg total) by mouth 2 (two) times daily. 20 capsule 0  . DOXYCYCLINE HYCLATE 100 MG PO CAPS Oral Take 1 capsule (100 mg total) by mouth 2 (two) times daily. 20 capsule 0    BP 134/100  Pulse 73  Temp 97.7 F (36.5 C)  Resp 18  SpO2 95%  Physical Exam  Nursing note and vitals reviewed. Constitutional: He is oriented to person, place, and time. He appears well-developed and well-nourished. No distress.  HENT:  Head: Normocephalic and atraumatic.  Right Ear: External ear normal.  Left Ear: External ear normal.  Mouth/Throat: Oropharynx is clear and moist.       No focal sinus tenderness, no erythema or swelling  Eyes: Conjunctivae are normal. Right eye exhibits no discharge. Left eye exhibits no discharge. No scleral icterus.  Neck: Neck supple. No rigidity. No tracheal deviation and normal range of motion present.  Cardiovascular:  Normal rate, regular rhythm and intact distal pulses.   Pulmonary/Chest: Effort normal and breath sounds normal. No stridor. No respiratory distress. He has no wheezes. He has no rales.       Occasional wheeze with end expiration  Abdominal: Soft. Bowel sounds are normal. He exhibits no distension. There is no tenderness. There is no rebound and no guarding.  Musculoskeletal: He exhibits no edema and no tenderness.  Neurological: He is alert and oriented to person, place, and time. He has normal strength. No cranial nerve  deficit ( no gross defecits noted) or sensory deficit. He exhibits normal muscle tone. He displays no seizure activity. Coordination normal.       No pronator drift bilateral upper extrem, able to hold both legs off bed for 5 seconds, sensation intact in all extremities, no visual field cuts, no left or right sided neglect  Skin: Skin is warm and dry. No rash noted.  Psychiatric: He has a normal mood and affect.    ED Course  Procedures (including critical care time)  Labs Reviewed - No data to display No results found.   1. Headache       MDM  I suspect the symptoms are related to his recent bronchitis infection. I doubt subarachnoid hemorrhage. There is no sudden thunderclap onset. Patient has no meningeal signs. I doubt meningitis. Patient will be treated with medications for pain and he has been given precautions to return to emergency room for fever, worsening symptoms       Celene Kras, MD 09/27/11 1135

## 2011-09-27 NOTE — Discharge Instructions (Signed)

## 2012-10-03 ENCOUNTER — Emergency Department (INDEPENDENT_AMBULATORY_CARE_PROVIDER_SITE_OTHER)
Admission: EM | Admit: 2012-10-03 | Discharge: 2012-10-03 | Disposition: A | Discharge status: Home / Self Care | Payer: Worker's Compensation | Source: Home / Self Care

## 2012-10-03 ENCOUNTER — Emergency Department (INDEPENDENT_AMBULATORY_CARE_PROVIDER_SITE_OTHER): Payer: Worker's Compensation

## 2012-10-03 ENCOUNTER — Encounter (HOSPITAL_COMMUNITY): Payer: Self-pay | Admitting: *Deleted

## 2012-10-03 DIAGNOSIS — M779 Enthesopathy, unspecified: Secondary | ICD-10-CM

## 2012-10-03 MED ORDER — FAMOTIDINE 20 MG PO TABS: 20.0000 mg | ORAL_TABLET | Freq: Two times a day (BID) | ORAL | Status: DC

## 2012-10-03 MED ORDER — PREDNISONE 20 MG PO TABS: 40.0000 mg | ORAL_TABLET | Freq: Every day | ORAL | Status: AC

## 2012-10-03 MED ORDER — NAPROXEN 375 MG PO TABS: 375.0000 mg | ORAL_TABLET | Freq: Two times a day (BID) | ORAL | Status: DC

## 2012-10-03 NOTE — ED Notes (Signed)
Patient states he injured his right thumb Friday while cutting through a battery terminal cable. Patient complains of pain swelling and is unable to move right thumb at 2nd joint.
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
## 2013-07-18 ENCOUNTER — Emergency Department (HOSPITAL_BASED_OUTPATIENT_CLINIC_OR_DEPARTMENT_OTHER)
Admission: EM | Admit: 2013-07-18 | Discharge: 2013-07-18 | Disposition: A | Discharge status: Home / Self Care | Payer: BC Managed Care – PPO | Attending: Emergency Medicine | Admitting: Emergency Medicine

## 2013-07-18 ENCOUNTER — Encounter (HOSPITAL_BASED_OUTPATIENT_CLINIC_OR_DEPARTMENT_OTHER): Payer: Self-pay | Admitting: Emergency Medicine

## 2013-07-18 DIAGNOSIS — G8929 Other chronic pain: Secondary | ICD-10-CM | POA: Insufficient documentation

## 2013-07-18 DIAGNOSIS — M6281 Muscle weakness (generalized): Secondary | ICD-10-CM | POA: Insufficient documentation

## 2013-07-18 DIAGNOSIS — F17200 Nicotine dependence, unspecified, uncomplicated: Secondary | ICD-10-CM | POA: Insufficient documentation

## 2013-07-18 DIAGNOSIS — M79609 Pain in unspecified limb: Secondary | ICD-10-CM | POA: Insufficient documentation

## 2013-07-18 DIAGNOSIS — Z791 Long term (current) use of non-steroidal anti-inflammatories (NSAID): Secondary | ICD-10-CM | POA: Insufficient documentation

## 2013-07-18 DIAGNOSIS — M79605 Pain in left leg: Secondary | ICD-10-CM

## 2013-07-18 DIAGNOSIS — Z9889 Other specified postprocedural states: Secondary | ICD-10-CM | POA: Insufficient documentation

## 2013-07-18 DIAGNOSIS — R209 Unspecified disturbances of skin sensation: Secondary | ICD-10-CM | POA: Insufficient documentation

## 2013-07-18 DIAGNOSIS — Z79899 Other long term (current) drug therapy: Secondary | ICD-10-CM | POA: Insufficient documentation

## 2013-07-18 MED ORDER — KETOROLAC TROMETHAMINE 60 MG/2ML IM SOLN
60.0000 mg | Freq: Once | INTRAMUSCULAR | Status: AC
Start: 2013-07-18 — End: 2013-07-18
  Administered 2013-07-18: 60 mg via INTRAMUSCULAR
  Filled 2013-07-18: qty 2

## 2013-07-18 MED ORDER — CYCLOBENZAPRINE HCL 5 MG PO TABS: 5.0000 mg | ORAL_TABLET | Freq: Three times a day (TID) | ORAL | Status: DC | PRN

## 2013-07-18 NOTE — ED Notes (Signed)
Pt c/o left ankle pain x 8 wks after "rolling ankle". Pt also c/o right knee pain chronic. Pt ambulatory without difficulty.

## 2013-07-18 NOTE — ED Provider Notes (Signed)
CSN: [id number]     Arrival date & time 07/18/13  1057 History   First MD Initiated Contact with Patient 07/18/13 1104     Chief Complaint  Patient presents with  . Leg Pain   (Consider location/radiation/quality/duration/timing/severity/associated sxs/prior Treatment) Patient is a 37 y.o. male presenting with leg pain.  Leg Pain Location:  Leg Injury: no   Leg location:  L leg Pain details:    Quality: tightness    Radiates to: up left leg to hip.   Severity:  Severe   Onset quality:  Gradual   Duration:  3 weeks   Timing:  Constant   Progression:  Worsening Chronicity: hx of sciatica in left leg. Relieved by:  Nothing Ineffective treatments: tried ibuprofen for a while, none in several days. Associated symptoms: muscle weakness (right leg, chronic) and tingling (when standing for long time)   Associated symptoms: no back pain, no fever and no numbness   Associated symptoms comment:  No bowel/bladder dysfunction, no perineal numbness   History reviewed. No pertinent past medical history. Past Surgical History  Procedure Laterality Date  . Knee surgery    . Back surgery    . Foot surgery     No family history on file. History  Substance Use Topics  . Smoking status: Current Every Day Smoker -- 0.50 packs/day  . Smokeless tobacco: Not on file  . Alcohol Use: Yes     Comment: occasional    Review of Systems  Constitutional: Negative for fever.  HENT: Negative for congestion.   Respiratory: Negative for cough and shortness of breath.   Cardiovascular: Negative for chest pain.  Gastrointestinal: Negative for nausea, vomiting, abdominal pain and diarrhea.  Musculoskeletal: Negative for back pain.  All other systems reviewed and are negative.    Allergies  Review of patient's allergies indicates no known allergies.  Home Medications   Current Outpatient Rx  Name  Route  Sig  Dispense  Refill  . acetaminophen (TYLENOL) 325 MG tablet   Oral   Take 650 mg by  mouth every 6 (six) hours as needed. For pain         . EXPIRED: albuterol (PROVENTIL HFA;VENTOLIN HFA) 108 (90 BASE) MCG/ACT inhaler   Inhalation   Inhale 2 puffs into the lungs every 6 (six) hours as needed for wheezing.   1 Inhaler   0   . famotidine (PEPCID) 20 MG tablet   Oral   Take 1 tablet (20 mg total) by mouth 2 (two) times daily.   30 tablet   0   . naproxen (NAPROSYN) 375 MG tablet   Oral   Take 1 tablet (375 mg total) by mouth 2 (two) times daily with a meal.   14 tablet   0    BP 140/84  Pulse 90  Temp(Src) 98.7 F (37.1 C) (Oral)  Resp 18  Ht 5\' 11"  (1.803 m)  Wt 242 lb (109.77 kg)  BMI 33.77 kg/m2  SpO2 98% Physical Exam  Nursing note and vitals reviewed. Constitutional: He is oriented to person, place, and time. He appears well-developed and well-nourished. No distress.  HENT:  Head: Normocephalic and atraumatic.  Eyes: Conjunctivae are normal. No scleral icterus.  Neck: Neck supple.  Cardiovascular: Normal rate and intact distal pulses.   Pulmonary/Chest: Effort normal. No stridor. No respiratory distress.  Abdominal: Normal appearance. He exhibits no distension.  Musculoskeletal:  No posterior leg tenderness, but pain with passive calf and hamstring stretch.  Mild TTP in left  gluteal region.  2+ distal pulses BLE.  No edema, warmth, swelling in BLE.  Neurological: He is alert and oriented to person, place, and time.  Skin: Skin is warm and dry. No rash noted.  Psychiatric: He has a normal mood and affect. His behavior is normal.    ED Course  Procedures (including critical care time) Labs Review Labs Reviewed - No data to display Imaging Review No results found.  EKG Interpretation   None       MDM   1. Leg pain, left    37 yo male with apparent musculoskeletal pain in posterior left leg.  Some tightness and tenderness in left gluteal region.  Positive straight leg raise test.  He reports a minor injury to his left ankle about a  month ago.  This may have triggered his pain secondary to altered mechanics, but does not seem to be the source of his current pain.  No significant midline back pain and no signs or symptoms concerning for spinal impingement.  Clinical picture not c/w DVT or arterial insufficiency.  Plan scheduled NSAIDs, flexeril, light stretching, PCP and physical therapy follow up.      Candyce Churn, MD 07/18/13 [phone]

## 2013-09-28 NOTE — Progress Notes (Signed)
Need orders in EPIC.  Surgery on 10/12/13.  Preop on 10/04/13 at 0800am.  Thank You.

## 2013-09-29 ENCOUNTER — Encounter (HOSPITAL_COMMUNITY): Payer: Self-pay | Admitting: Pharmacy Technician

## 2013-09-29 NOTE — Progress Notes (Signed)
Surgery scheduled for 10/12/13.  Preop on 10/04/13 at 0800am.  Need orders in EPIC.  Thank You.

## 2013-09-30 NOTE — Progress Notes (Signed)
Surgery on 10/12/13.  Preop 10/04/13 at 0800am.  Need orders in EPIC.  Thank You.

## 2013-10-03 ENCOUNTER — Other Ambulatory Visit (HOSPITAL_COMMUNITY): Payer: Self-pay | Admitting: Orthopedic Surgery

## 2013-10-03 NOTE — Progress Notes (Signed)
Dr Darrelyn HillockGioffre-  Need Pre op orders please-  Has PST appt 10/04/13 0800 Thanks

## 2013-10-04 ENCOUNTER — Encounter (HOSPITAL_COMMUNITY)
Admission: RE | Admit: 2013-10-04 | Discharge: 2013-10-04 | Disposition: A | Discharge status: Home / Self Care | Payer: BC Managed Care – PPO | Source: Ambulatory Visit | Attending: Orthopedic Surgery | Admitting: Orthopedic Surgery

## 2013-10-04 ENCOUNTER — Encounter (HOSPITAL_COMMUNITY): Payer: Self-pay

## 2013-10-04 DIAGNOSIS — Z01812 Encounter for preprocedural laboratory examination: Secondary | ICD-10-CM | POA: Insufficient documentation

## 2013-10-04 DIAGNOSIS — M5430 Sciatica, unspecified side: Secondary | ICD-10-CM

## 2013-10-04 DIAGNOSIS — M549 Dorsalgia, unspecified: Secondary | ICD-10-CM

## 2013-10-04 HISTORY — DX: Sciatica, unspecified side: M54.9

## 2013-10-04 HISTORY — DX: Sciatica, unspecified side: M54.30

## 2013-10-04 HISTORY — DX: Unspecified osteoarthritis, unspecified site: M19.90

## 2013-10-04 HISTORY — DX: Dorsalgia, unspecified: M54.9

## 2013-10-04 HISTORY — DX: Heartburn: R12

## 2013-10-04 LAB — CBC
HEMATOCRIT: 41.4 % (ref 39.0–52.0)
Hemoglobin: 14.2 g/dL (ref 13.0–17.0)
MCH: 32.4 pg (ref 26.0–34.0)
MCHC: 34.3 g/dL (ref 30.0–36.0)
MCV: 94.5 fL (ref 78.0–100.0)
Platelets: 303 10*3/uL (ref 150–400)
RBC: 4.38 MIL/uL (ref 4.22–5.81)
RDW: 12.6 % (ref 11.5–15.5)
WBC: 7.1 10*3/uL (ref 4.0–10.5)

## 2013-10-04 LAB — SURGICAL PCR SCREEN
MRSA, PCR: NEGATIVE
Staphylococcus aureus: NEGATIVE

## 2013-10-04 NOTE — Progress Notes (Signed)
10/04/13 0816  OBSTRUCTIVE SLEEP APNEA  Have you ever been diagnosed with sleep apnea through a sleep study? No  Do you snore loudly (loud enough to be heard through closed doors)?  1  Do you often feel tired, fatigued, or sleepy during the daytime? 1  Has anyone observed you stop breathing during your sleep? 0  Do you have, or are you being treated for high blood pressure? 0  BMI more than 35 kg/m2? 1  Age over 38 years old? 0  Neck circumference greater than 40 cm/18 inches? 0  Gender: 1  Obstructive Sleep Apnea Score 4  Score 4 or greater  Results sent to PCP

## 2013-10-04 NOTE — Patient Instructions (Signed)
      [facility]: [facility]12[facility]13  [facility]  [facility] at  0630     AM.  Call this number if you have problems the morning of surgery: [phone]        Do not eat food  Or drink :After Midnight.TUESDAY NIGHT   Take these medicines the morning of surgery with A SIP OF WATER:NONE   .  Contacts, dentures or partial plates, or metal hairpins  can not be worn to surgery. Your family will be responsible for glasses, dentures, hearing aides while you are in surgery  Leave suitcase in the car. After surgery it may be brought to your room.  For patients admitted to the hospital, checkout time is 11:00 AM day of  discharge.                DO NOT WEAR JEWELRY, LOTIONS, POWDERS, OR PERFUMES.  WOMEN-- DO NOT SHAVE LEGS OR UNDERARMS FOR 48 HOURS BEFORE SHOWERS. MEN MAY SHAVE FACE.  Patients discharged the day of surgery will not be allowed to drive home. IF going home the day of surgery, you must have a driver and someone to stay with you for the first 24 hours  Name and phone number of your driver:                                                                        Please read over the following fact sheets that you were given: MRSA Information, Incentive Spirometry Sheet                     FAILURE TO FOLLOW THESE INSTRUCTIONS MAY RESULT IN  CANCELLATION   OF YOUR SURGERY                                                  Patient Signature _____________________________

## 2013-10-11 MED ORDER — CEFAZOLIN SODIUM 10 G IJ SOLR
3.0000 g | INTRAMUSCULAR | Status: AC
  Administered 2013-10-12: 3 g via INTRAVENOUS
  Filled 2013-10-11: qty 3000

## 2013-10-11 NOTE — H&P (Signed)
Keith AndreasJeffrey W [name] is an 38 y.o. male.   Chief Complaint: back pain HPI: The patient is a 38 year old male who presents with back pain. The patient reports low back symptoms including low back pain which began two months ago following a specific injury. The injury occurred at home due to a fall in the shower. Symptoms include pain, numbness, burning and stiffness. The pain radiates to the left buttock, left thigh, left lower leg and left foot. The patient describes the pain as aching and throbbing. The patient describes the severity of their symptoms as moderate in severity. Symptoms are exacerbated by standing, lifting and bending. MRI showed a previous hemilaminectomy at L5-S1. He has a disk herniation which is posterior lateral extrusion and displaces the S1 nerve root.  Past Medical History  Diagnosis Date  . Heartburn   . Arthritis   . Back pain with sciatica 10/04/13    left leg    Past Surgical History  Procedure Laterality Date  . Knee surgery    . Foot surgery    . Back surgery      lumbar x 2  . Cholecystectomy      Social History:  reports that he has been smoking.  He has never used smokeless tobacco. He reports that he drinks alcohol. He reports that he does not use illicit drugs.  Allergies: No Known Allergies  Current outpatient prescriptions: naproxen sodium (ANAPROX) 220 MG tablet, Take 660 mg by mouth 3 (three) times daily as needed (pain).,    Review of Systems  Constitutional: Negative.   HENT: Negative.   Eyes: Negative.   Respiratory: Negative.   Cardiovascular: Negative.   Gastrointestinal: Negative.   Genitourinary: Negative.   Musculoskeletal: Positive for back pain, falls and joint pain. Negative for myalgias and neck pain.  Skin: Negative.   Neurological: Positive for tingling and sensory change. Negative for dizziness, tremors, speech change, focal weakness, seizures and loss of consciousness.  Endo/Heme/Allergies: Negative.   Psychiatric/Behavioral:  Negative.    Vitals Weight: 252 lb Height: 71 in Body Surface Area: 2.39 m Body Mass Index: 35.15 kg/m Pulse: 89 (Regular) BP: 140/90 (Sitting, Left Arm, Standard)  Physical Exam  Constitutional: He is oriented to person, place, and time. He appears well-developed and well-nourished. No distress.  HENT:  Head: Normocephalic and atraumatic.  Right Ear: External ear normal.  Left Ear: External ear normal.  Nose: Nose normal.  Mouth/Throat: Oropharynx is clear and moist.  Eyes: Conjunctivae and EOM are normal.  Neck: Normal range of motion. Neck supple.  Cardiovascular: Normal rate, regular rhythm, normal heart sounds and intact distal pulses.   No murmur heard. Respiratory: Effort normal and breath sounds normal. No respiratory distress. He has no wheezes.  GI: Soft. Bowel sounds are normal. He exhibits no distension. There is no tenderness.  Musculoskeletal:       Right hip: Normal.       Left hip: Normal.       Right knee: Normal.       Left knee: Normal.       Lumbar back: He exhibits decreased range of motion, tenderness, pain and spasm.       Right lower leg: He exhibits no tenderness and no swelling.       Left lower leg: He exhibits no tenderness and no swelling.  He has a positive contralateral straight leg raising on the right and a positive straight leg raising on the left.  Neurological: He is alert and oriented  to person, place, and time. He has normal strength and normal reflexes. No sensory deficit.  Skin: No rash noted. He is not diaphoretic. No erythema.  Psychiatric: He has a normal mood and affect. His behavior is normal.     Assessment/Plan Lumbar disc herniation L5-S1, left He needs a lumbar hemilaminectomy and microdiscectomy L5-S1 on the left. Risks and benefits of the surgery were discussed with the patient by Dr. Darrelyn Hillock.  The possible complications of spinal surgery number one could be infection, which is extremely rare. We do use antibiotics  prior to the surgery and during surgery and after surgery. Number two is always a slight degree of probability that you could develop a blood clot in your leg after any type of surgery and we try our best to prevent that with aspirin post op when it is safe to begin. The third is a dural leak. That is the spinal fluid leak that could occur. At certain rare times the bone or the disc could literally stick to the dura which is the lining which contains the spinal fluid and we could develop a small tear in that lining which we then patch up. That is an extremely rare complication. The last and final complication is a recurrent disc rupture. That means that you could rupture another small piece of disc later on down the road and there is about a 2% chance of that.    Adrean Findlay LAUREN 10/11/2013, 11:03 AM

## 2013-10-11 NOTE — Progress Notes (Signed)
Spoke to "deborah"  Dr Darrelyn Hillockgioffre office re: this is 5th request for orders for pt having surgery in am  Pt will arrive at 0630.  She will investigate and call back to short stay

## 2013-10-12 ENCOUNTER — Ambulatory Visit (HOSPITAL_COMMUNITY): Payer: BC Managed Care – PPO

## 2013-10-12 ENCOUNTER — Encounter (HOSPITAL_COMMUNITY)
Admission: RE | Disposition: A | Discharge status: Home / Self Care | Payer: Self-pay | Source: Ambulatory Visit | Attending: Orthopedic Surgery

## 2013-10-12 ENCOUNTER — Observation Stay (HOSPITAL_COMMUNITY)
Admission: RE | Admit: 2013-10-12 | Discharge: 2013-10-13 | Disposition: A | Discharge status: Home / Self Care | Payer: BC Managed Care – PPO | Source: Ambulatory Visit | Attending: Orthopedic Surgery | Admitting: Orthopedic Surgery

## 2013-10-12 ENCOUNTER — Encounter (HOSPITAL_COMMUNITY): Payer: Self-pay | Admitting: *Deleted

## 2013-10-12 ENCOUNTER — Encounter (HOSPITAL_COMMUNITY): Payer: BC Managed Care – PPO | Admitting: Certified Registered Nurse Anesthetist

## 2013-10-12 ENCOUNTER — Ambulatory Visit (HOSPITAL_COMMUNITY): Payer: BC Managed Care – PPO | Admitting: Certified Registered Nurse Anesthetist

## 2013-10-12 DIAGNOSIS — Z9089 Acquired absence of other organs: Secondary | ICD-10-CM | POA: Insufficient documentation

## 2013-10-12 DIAGNOSIS — M5126 Other intervertebral disc displacement, lumbar region: Principal | ICD-10-CM | POA: Insufficient documentation

## 2013-10-12 DIAGNOSIS — F17200 Nicotine dependence, unspecified, uncomplicated: Secondary | ICD-10-CM | POA: Insufficient documentation

## 2013-10-12 DIAGNOSIS — M48062 Spinal stenosis, lumbar region with neurogenic claudication: Secondary | ICD-10-CM | POA: Diagnosis present

## 2013-10-12 DIAGNOSIS — M216X9 Other acquired deformities of unspecified foot: Secondary | ICD-10-CM | POA: Insufficient documentation

## 2013-10-12 HISTORY — PX: HEMI-MICRODISCECTOMY LUMBAR LAMINECTOMY LEVEL 1: SHX5846

## 2013-10-12 LAB — URINALYSIS, ROUTINE W REFLEX MICROSCOPIC
Bilirubin Urine: NEGATIVE
Glucose, UA: NEGATIVE mg/dL
Hgb urine dipstick: NEGATIVE
Ketones, ur: NEGATIVE mg/dL
Leukocytes, UA: NEGATIVE
Nitrite: NEGATIVE
Protein, ur: NEGATIVE mg/dL
Specific Gravity, Urine: 1.024 (ref 1.005–1.030)
Urobilinogen, UA: 0.2 mg/dL (ref 0.0–1.0)
pH: 5.5 (ref 5.0–8.0)

## 2013-10-12 LAB — PROTIME-INR
INR: 0.94 (ref 0.00–1.49)
Prothrombin Time: 12.4 seconds (ref 11.6–15.2)

## 2013-10-12 LAB — COMPREHENSIVE METABOLIC PANEL
ALT: 58 U/L — ABNORMAL HIGH (ref 0–53)
AST: 32 U/L (ref 0–37)
Albumin: 3.7 g/dL (ref 3.5–5.2)
Alkaline Phosphatase: 58 U/L (ref 39–117)
BUN: 16 mg/dL (ref 6–23)
CO2: 24 mEq/L (ref 19–32)
Calcium: 9.7 mg/dL (ref 8.4–10.5)
Chloride: 101 mEq/L (ref 96–112)
Creatinine, Ser: 0.94 mg/dL (ref 0.50–1.35)
GFR calc Af Amer: >90 mL/min (ref >90)
GFR calc non Af Amer: >90 mL/min (ref >90)
Glucose, Bld: 121 mg/dL — ABNORMAL HIGH (ref 70–99)
Potassium: 4 mEq/L (ref 3.7–5.3)
Sodium: 138 mEq/L (ref 137–147)
Total Bilirubin: 0.4 mg/dL (ref 0.3–1.2)
Total Protein: 7 g/dL (ref 6.0–8.3)

## 2013-10-12 LAB — APTT: aPTT: 26 seconds (ref 24–37)

## 2013-10-12 SURGERY — HEMI-MICRODISCECTOMY LUMBAR LAMINECTOMY LEVEL 1
Anesthesia: General | Site: Back | Laterality: Left

## 2013-10-12 MED ORDER — FENTANYL CITRATE 0.05 MG/ML IJ SOLN
INTRAMUSCULAR | Status: DC | PRN
  Administered 2013-10-12: 50 ug via INTRAVENOUS
  Administered 2013-10-12: 100 ug via INTRAVENOUS
  Administered 2013-10-12 (×4): 50 ug via INTRAVENOUS

## 2013-10-12 MED ORDER — PROPOFOL 10 MG/ML IV BOLUS
INTRAVENOUS | Status: DC | PRN
  Administered 2013-10-12: 200 mg via INTRAVENOUS

## 2013-10-12 MED ORDER — HYDROCODONE-ACETAMINOPHEN 5-325 MG PO TABS: 1.0000 | ORAL_TABLET | ORAL | Status: DC | PRN

## 2013-10-12 MED ORDER — GLYCOPYRROLATE 0.2 MG/ML IJ SOLN
INTRAMUSCULAR | Status: DC | PRN
  Administered 2013-10-12: .8 mg via INTRAVENOUS

## 2013-10-12 MED ORDER — MIDAZOLAM HCL 5 MG/5ML IJ SOLN
INTRAMUSCULAR | Status: DC | PRN
  Administered 2013-10-12: 2 mg via INTRAVENOUS

## 2013-10-12 MED ORDER — LACTATED RINGERS IV SOLN: INTRAVENOUS | Status: DC

## 2013-10-12 MED ORDER — PHENYLEPHRINE HCL 10 MG/ML IJ SOLN
INTRAMUSCULAR | Status: AC
  Filled 2013-10-12: qty 1

## 2013-10-12 MED ORDER — MIDAZOLAM HCL 2 MG/2ML IJ SOLN
INTRAMUSCULAR | Status: AC
  Filled 2013-10-12: qty 2

## 2013-10-12 MED ORDER — ATROPINE SULFATE 0.4 MG/ML IJ SOLN
INTRAMUSCULAR | Status: AC
  Filled 2013-10-12: qty 1

## 2013-10-12 MED ORDER — HYDROMORPHONE HCL PF 2 MG/ML IJ SOLN
INTRAMUSCULAR | Status: AC
  Filled 2013-10-12: qty 1

## 2013-10-12 MED ORDER — PHENOL 1.4 % MT LIQD: 1.0000 | OROMUCOSAL | Status: DC | PRN

## 2013-10-12 MED ORDER — BUPIVACAINE-EPINEPHRINE (PF) 0.5% -1:200000 IJ SOLN
INTRAMUSCULAR | Status: DC | PRN
  Administered 2013-10-12: 20 mL

## 2013-10-12 MED ORDER — HYDROMORPHONE HCL PF 1 MG/ML IJ SOLN: 0.2500 mg | INTRAMUSCULAR | Status: DC | PRN

## 2013-10-12 MED ORDER — CEFAZOLIN SODIUM 1-5 GM-% IV SOLN
1.0000 g | Freq: Three times a day (TID) | INTRAVENOUS | Status: AC
  Administered 2013-10-12 – 2013-10-13 (×3): 1 g via INTRAVENOUS
  Filled 2013-10-12 (×3): qty 50

## 2013-10-12 MED ORDER — LACTATED RINGERS IV SOLN
INTRAVENOUS | Status: DC
  Administered 2013-10-12: 19:00:00 via INTRAVENOUS

## 2013-10-12 MED ORDER — NEOSTIGMINE METHYLSULFATE 1 MG/ML IJ SOLN
INTRAMUSCULAR | Status: AC
  Filled 2013-10-12: qty 10

## 2013-10-12 MED ORDER — PROPOFOL 10 MG/ML IV BOLUS
INTRAVENOUS | Status: AC
  Filled 2013-10-12: qty 20

## 2013-10-12 MED ORDER — THROMBIN 5000 UNITS EX SOLR
OROMUCOSAL | Status: DC | PRN
  Administered 2013-10-12: 09:00:00 via TOPICAL

## 2013-10-12 MED ORDER — NEOSTIGMINE METHYLSULFATE 1 MG/ML IJ SOLN
INTRAMUSCULAR | Status: DC | PRN
  Administered 2013-10-12: 5 mg via INTRAVENOUS

## 2013-10-12 MED ORDER — BACITRACIN-NEOMYCIN-POLYMYXIN 400-5-5000 EX OINT
TOPICAL_OINTMENT | CUTANEOUS | Status: AC
  Filled 2013-10-12: qty 1

## 2013-10-12 MED ORDER — MEPERIDINE HCL 50 MG/ML IJ SOLN: 6.2500 mg | INTRAMUSCULAR | Status: DC | PRN

## 2013-10-12 MED ORDER — HYDROMORPHONE HCL PF 1 MG/ML IJ SOLN
INTRAMUSCULAR | Status: DC | PRN
  Administered 2013-10-12 (×2): 1 mg via INTRAVENOUS

## 2013-10-12 MED ORDER — LABETALOL HCL 5 MG/ML IV SOLN
INTRAVENOUS | Status: AC
  Filled 2013-10-12: qty 4

## 2013-10-12 MED ORDER — LABETALOL HCL 5 MG/ML IV SOLN
INTRAVENOUS | Status: DC | PRN
  Administered 2013-10-12: 5 mg via INTRAVENOUS

## 2013-10-12 MED ORDER — DEXAMETHASONE SODIUM PHOSPHATE 10 MG/ML IJ SOLN
INTRAMUSCULAR | Status: DC | PRN
  Administered 2013-10-12: 10 mg via INTRAVENOUS

## 2013-10-12 MED ORDER — GLYCOPYRROLATE 0.2 MG/ML IJ SOLN
INTRAMUSCULAR | Status: AC
  Filled 2013-10-12: qty 3

## 2013-10-12 MED ORDER — SODIUM CHLORIDE 0.9 % IR SOLN
Status: DC | PRN
  Administered 2013-10-12: 09:00:00

## 2013-10-12 MED ORDER — ROCURONIUM BROMIDE 100 MG/10ML IV SOLN
INTRAVENOUS | Status: AC
  Filled 2013-10-12: qty 1

## 2013-10-12 MED ORDER — METHOCARBAMOL 100 MG/ML IJ SOLN
500.0000 mg | Freq: Four times a day (QID) | INTRAVENOUS | Status: DC | PRN
  Filled 2013-10-12: qty 5

## 2013-10-12 MED ORDER — FENTANYL CITRATE 0.05 MG/ML IJ SOLN
INTRAMUSCULAR | Status: AC
  Filled 2013-10-12: qty 5

## 2013-10-12 MED ORDER — HYDROMORPHONE HCL PF 1 MG/ML IJ SOLN: 0.5000 mg | INTRAMUSCULAR | Status: DC | PRN

## 2013-10-12 MED ORDER — ACETAMINOPHEN 10 MG/ML IV SOLN
1000.0000 mg | Freq: Once | INTRAVENOUS | Status: AC
  Administered 2013-10-12: 1000 mg via INTRAVENOUS
  Filled 2013-10-12: qty 100

## 2013-10-12 MED ORDER — ROCURONIUM BROMIDE 100 MG/10ML IV SOLN
INTRAVENOUS | Status: DC | PRN
  Administered 2013-10-12: 60 mg via INTRAVENOUS

## 2013-10-12 MED ORDER — PROMETHAZINE HCL 25 MG/ML IJ SOLN: 6.2500 mg | INTRAMUSCULAR | Status: DC | PRN

## 2013-10-12 MED ORDER — THROMBIN 5000 UNITS EX SOLR
CUTANEOUS | Status: AC
  Filled 2013-10-12: qty 10000

## 2013-10-12 MED ORDER — FLEET ENEMA 7-19 GM/118ML RE ENEM: 1.0000 | ENEMA | Freq: Once | RECTAL | Status: AC | PRN

## 2013-10-12 MED ORDER — MENTHOL 3 MG MT LOZG: 1.0000 | LOZENGE | OROMUCOSAL | Status: DC | PRN

## 2013-10-12 MED ORDER — ACETAMINOPHEN 325 MG PO TABS: 650.0000 mg | ORAL_TABLET | ORAL | Status: DC | PRN

## 2013-10-12 MED ORDER — EPHEDRINE SULFATE 50 MG/ML IJ SOLN
INTRAMUSCULAR | Status: DC | PRN
  Administered 2013-10-12: 5 mg via INTRAVENOUS

## 2013-10-12 MED ORDER — POLYETHYLENE GLYCOL 3350 17 G PO PACK: 17.0000 g | PACK | Freq: Every day | ORAL | Status: DC | PRN

## 2013-10-12 MED ORDER — ONDANSETRON HCL 4 MG/2ML IJ SOLN: 4.0000 mg | INTRAMUSCULAR | Status: DC | PRN

## 2013-10-12 MED ORDER — OXYCODONE-ACETAMINOPHEN 5-325 MG PO TABS: 1.0000 | ORAL_TABLET | ORAL | Status: DC | PRN

## 2013-10-12 MED ORDER — BACITRACIN-NEOMYCIN-POLYMYXIN 400-5-5000 EX OINT
TOPICAL_OINTMENT | CUTANEOUS | Status: DC | PRN
  Administered 2013-10-12: 1 via TOPICAL

## 2013-10-12 MED ORDER — LIDOCAINE HCL (CARDIAC) 20 MG/ML IV SOLN
INTRAVENOUS | Status: DC | PRN
  Administered 2013-10-12: 100 mg via INTRAVENOUS

## 2013-10-12 MED ORDER — ONDANSETRON HCL 4 MG/2ML IJ SOLN
INTRAMUSCULAR | Status: DC | PRN
  Administered 2013-10-12: 4 mg via INTRAVENOUS

## 2013-10-12 MED ORDER — FENTANYL CITRATE 0.05 MG/ML IJ SOLN
INTRAMUSCULAR | Status: AC
  Filled 2013-10-12: qty 2

## 2013-10-12 MED ORDER — ACETAMINOPHEN 650 MG RE SUPP: 650.0000 mg | RECTAL | Status: DC | PRN

## 2013-10-12 MED ORDER — BUPIVACAINE LIPOSOME 1.3 % IJ SUSP
20.0000 mL | Freq: Once | INTRAMUSCULAR | Status: AC
  Administered 2013-10-12: 20 mL
  Filled 2013-10-12: qty 20

## 2013-10-12 MED ORDER — LIDOCAINE HCL (CARDIAC) 20 MG/ML IV SOLN
INTRAVENOUS | Status: AC
  Filled 2013-10-12: qty 5

## 2013-10-12 MED ORDER — ONDANSETRON HCL 4 MG/2ML IJ SOLN
INTRAMUSCULAR | Status: AC
  Filled 2013-10-12: qty 2

## 2013-10-12 MED ORDER — SUCCINYLCHOLINE CHLORIDE 20 MG/ML IJ SOLN
INTRAMUSCULAR | Status: DC | PRN
  Administered 2013-10-12: 140 mg via INTRAVENOUS

## 2013-10-12 MED ORDER — METHOCARBAMOL 500 MG PO TABS: 500.0000 mg | ORAL_TABLET | Freq: Four times a day (QID) | ORAL | Status: DC | PRN

## 2013-10-12 MED ORDER — BUPIVACAINE-EPINEPHRINE PF 0.5-1:200000 % IJ SOLN
INTRAMUSCULAR | Status: AC
  Filled 2013-10-12: qty 30

## 2013-10-12 MED ORDER — LACTATED RINGERS IV SOLN
INTRAVENOUS | Status: DC
  Administered 2013-10-12 (×2): via INTRAVENOUS

## 2013-10-12 MED ORDER — BISACODYL 10 MG RE SUPP: 10.0000 mg | Freq: Every day | RECTAL | Status: DC | PRN

## 2013-10-12 MED ORDER — DEXAMETHASONE SODIUM PHOSPHATE 10 MG/ML IJ SOLN
INTRAMUSCULAR | Status: AC
  Filled 2013-10-12: qty 1

## 2013-10-12 SURGICAL SUPPLY — 45 items
APL SKNCLS STERI-STRIP NONHPOA (GAUZE/BANDAGES/DRESSINGS) ×1
BAG SPEC THK2 15X12 ZIP CLS (MISCELLANEOUS)
BAG ZIPLOCK 12X15 (MISCELLANEOUS) IMPLANT
BENZOIN TINCTURE PRP APPL 2/3 (GAUZE/BANDAGES/DRESSINGS) ×2 IMPLANT
CLEANER TIP ELECTROSURG 2X2 (MISCELLANEOUS) ×2 IMPLANT
CONT SPECI 4OZ STER CLIK (MISCELLANEOUS) IMPLANT
DRAIN PENROSE 18X1/4 LTX STRL (WOUND CARE) IMPLANT
DRAPE MICROSCOPE LEICA (MISCELLANEOUS) ×2 IMPLANT
DRAPE POUCH INSTRU U-SHP 10X18 (DRAPES) ×2 IMPLANT
DRAPE SURG 17X11 SM STRL (DRAPES) ×2 IMPLANT
DRSG ADAPTIC 3X8 NADH LF (GAUZE/BANDAGES/DRESSINGS) ×2 IMPLANT
DRSG PAD ABDOMINAL 8X10 ST (GAUZE/BANDAGES/DRESSINGS) ×4 IMPLANT
DURAPREP 26ML APPLICATOR (WOUND CARE) ×2 IMPLANT
ELECT BLADE TIP CTD 4 INCH (ELECTRODE) ×2 IMPLANT
ELECT REM PT RETURN 9FT ADLT (ELECTROSURGICAL) ×2
ELECTRODE REM PT RTRN 9FT ADLT (ELECTROSURGICAL) ×1 IMPLANT
GLOVE BIOGEL PI IND STRL 8 (GLOVE) ×1 IMPLANT
GLOVE BIOGEL PI INDICATOR 8 (GLOVE) ×1
GLOVE ECLIPSE 8.0 STRL XLNG CF (GLOVE) ×4 IMPLANT
GLOVE SURG SS PI 8.0 STRL IVOR (GLOVE) ×2 IMPLANT
GOWN STRL REUS W/TWL XL LVL3 (GOWN DISPOSABLE) ×4 IMPLANT
KIT BASIN OR (CUSTOM PROCEDURE TRAY) ×2 IMPLANT
KIT POSITIONING SURG ANDREWS (MISCELLANEOUS) ×2 IMPLANT
MANIFOLD NEPTUNE II (INSTRUMENTS) ×2 IMPLANT
NDL SPNL 18GX3.5 QUINCKE PK (NEEDLE) ×2 IMPLANT
NEEDLE SPNL 18GX3.5 QUINCKE PK (NEEDLE) ×4 IMPLANT
NS IRRIG 1000ML POUR BTL (IV SOLUTION) IMPLANT
PATTIES SURGICAL .5 X.5 (GAUZE/BANDAGES/DRESSINGS) IMPLANT
PATTIES SURGICAL .75X.75 (GAUZE/BANDAGES/DRESSINGS) IMPLANT
PATTIES SURGICAL 1X1 (DISPOSABLE) IMPLANT
PIN SAFETY NICK PLATE  2 MED (MISCELLANEOUS)
PIN SAFETY NICK PLATE 2 MED (MISCELLANEOUS) IMPLANT
SPONGE GAUZE 4X4 12PLY (GAUZE/BANDAGES/DRESSINGS) ×1 IMPLANT
SPONGE LAP 4X18 X RAY DECT (DISPOSABLE) ×3 IMPLANT
SPONGE SURGIFOAM ABS GEL 100 (HEMOSTASIS) ×2 IMPLANT
STAPLER VISISTAT 35W (STAPLE) ×2 IMPLANT
SUT VIC AB 0 CT1 27 (SUTURE) ×2
SUT VIC AB 0 CT1 27XBRD ANTBC (SUTURE) ×1 IMPLANT
SUT VIC AB 1 CT1 27 (SUTURE) ×6
SUT VIC AB 1 CT1 27XBRD ANTBC (SUTURE) ×3 IMPLANT
SUT VIC AB 2-0 CT1 27 (SUTURE) ×4
SUT VIC AB 2-0 CT1 TAPERPNT 27 (SUTURE) IMPLANT
TOWEL OR 17X26 10 PK STRL BLUE (TOWEL DISPOSABLE) ×2 IMPLANT
TRAY FOLEY METER SIL LF 16FR (CATHETERS) ×1 IMPLANT
TRAY LAMINECTOMY (CUSTOM PROCEDURE TRAY) ×2 IMPLANT

## 2013-10-12 NOTE — Interval H&P Note (Signed)
History and Physical Interval Note:  10/12/2013 8:20 AM  Keith AndreasJeffrey W Saadeh  has presented today for surgery, with the diagnosis of HERNIATED DISC  The various methods of treatment have been discussed with the patient and family. After consideration of risks, benefits and other options for treatment, the patient has consented to  Procedure(s): HEMI-LAMINECTOMY MICRODISCECTOMY L5-S1 ON LEFT (LEVEL 1) (Left) as a surgical intervention .  The patient's history has been reviewed, patient examined, no change in status, stable for surgery.  I have reviewed the patient's chart and labs.  Questions were answered to the patient's satisfaction.     [name]

## 2013-10-12 NOTE — Plan of Care (Signed)
Problem: Consults Goal: Diagnosis - Spinal Surgery Hemi Lam L5s1

## 2013-10-12 NOTE — Anesthesia Preprocedure Evaluation (Addendum)
Anesthesia Evaluation  Patient identified by MRN, date of birth, ID band Patient awake    Reviewed: Allergy & Precautions, H&P , NPO status , Patient's Chart, lab work & pertinent test results  Airway Mallampati: II TM Distance: >3 FB Neck ROM: Full    Dental no notable dental hx. (+) Edentulous Upper   Pulmonary neg pulmonary ROS, Current Smoker,  breath sounds clear to auscultation  Pulmonary exam normal       Cardiovascular negative cardio ROS  Rhythm:Regular Rate:Normal     Neuro/Psych negative neurological ROS  negative psych ROS   GI/Hepatic negative GI ROS, Neg liver ROS,   Endo/Other  negative endocrine ROS  Renal/GU negative Renal ROS  negative genitourinary   Musculoskeletal negative musculoskeletal ROS (+)   Abdominal   Peds negative pediatric ROS (+)  Hematology negative hematology ROS (+)   Anesthesia Other Findings   Reproductive/Obstetrics negative OB ROS                          Anesthesia Physical Anesthesia Plan  ASA: II  Anesthesia Plan: General   Post-op Pain Management:    Induction: Intravenous  Airway Management Planned: Oral ETT  Additional Equipment:   Intra-op Plan:   Post-operative Plan: Extubation in OR  Informed Consent: I have reviewed the patients History and Physical, chart, labs and discussed the procedure including the risks, benefits and alternatives for the proposed anesthesia with the patient or authorized representative who has indicated his/her understanding and acceptance.   Dental advisory given  Plan Discussed with: CRNA  Anesthesia Plan Comments:         Anesthesia Quick Evaluation

## 2013-10-12 NOTE — Brief Op Note (Signed)
10/12/2013  9:59 AM  PATIENT:  Keith [name]  38 y.o. 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 RECURRENT HNP  SURGEON:  Surgeon(s) and Role:    * Keith Conesonald A Lin Hackmann, MD - Primary    * Keith DockerJeffrey C Beane, MD - Assisting     ASSISTANTS: Keith EveryJeffrey Beane MD   ANESTHESIA:   general  EBL:     BLOOD ADMINISTERED:none  DRAINS: none   LOCAL MEDICATIONS USED:  [facility] and at end of case 20cc of Exparel.  SPECIMEN:  Source of Specimen:  L-5-S-1 Disc Space  DISPOSITION OF SPECIMEN:  PATHOLOGY  COUNTS:  YES  TOURNIQUET:  * No tourniquets in log *  DICTATION: .Other Dictation: Dictation Number [id number]  PLAN OF CARE: Admit for overnight observation  PATIENT DISPOSITION:  Stable in OR   Delay start of Pharmacological VTE agent (>24hrs) due to surgical blood loss or risk of bleeding: yes

## 2013-10-12 NOTE — Op Note (Signed)
NAMMaye Hides:  Keith [name], Keith [name]              ACCOUNT NO.:  [id number]  MEDICAL RECORD NO.:  [id number]  LOCATION:  [facility]                         [facility]  PHYSICIAN:  Georges Lynchonald A. Scotti Kosta, M.D.DATE OF BIRTH:  12/25/1975  DATE OF PROCEDURE:  10/12/2013 DATE OF DISCHARGE:                              OPERATIVE REPORT   SURGEON:  Georges Lynchonald A. Darrelyn HillockGioffre, MD  ASSISTANT:  Jene EveryJeffrey Beane, MD  PREOPERATIVE DIAGNOSES: 1. Recurrent herniated disk x3 at L5-S1 on the left. 2. Partial footdrop on the left. 3. Spinal stenosis. 4. Foraminal stenosis for the L5 and the S1 roots on the left.  POSTOPERATIVE DIAGNOSES: 1. Recurrent herniated disk x3 at L5-S1 on the left. 2. Partial footdrop on the left. 3. Spinal stenosis. 4. Foraminal stenosis for the L5 and the S1 roots on the left.  OPERATION: 1. Hemi laminectomy at L5-S1 on the left. 2. Decompression of the lateral recess at L5 on the left with excision     of scar tissue. 3. Foraminotomy for the L5 root on the left. 4. Foraminotomy for the S1 root on the left. 5. Microdiskectomy L5-S1 on the left for recurrent disk.  Note x-rays were taken.  DESCRIPTION OF PROCEDURE:  Under general anesthesia, routine orthopedic prepping and draping of the lower back was carried out with the patient on a spinal frame.  At this time, the appropriate x-rays were taken after 2 needles were placed in the back.  I first did the appropriate time-out before we inserted the needles.  I also marked the appropriate left side in the holding area.  At this time,  an incision was made over the L5-S1.  Bleeders were identified and cauterized.  The muscle was separated from the lamina spinous process bilaterally.  Self-retaining retractors were inserted.  Another x-ray was taken.  They showed L5-S1 disk space.  We obviously entered L5-S1 disk space because of all the scar tissue and a previous laminectomy.  We dissected the scar tissue off in the usual fashion.  We separated  the scar from the lamina above and below, and then extended our laminectomy, we went up laterally as well.  We identified the S1 root gently and retracted that.  At this particular time, we then went down and identified the L5-S1 disk space. I then went up proximally and carried out a partial laminectomy to make sure we went out into the foramen which we did of the L5 root and freed it up as well.  After this, we identified with microscope.  We did a microscopic exam.  We brought the microscope in and made a cruciate incision in the posterior longitudinal ligament, and then removed multiple fragments of disk.  We thoroughly decompressed the disk space. We went out into the foramen of the L5 root above to make sure that was free as well.  We thoroughly irrigated out the area and closed the wound layers in usual fashion.  We left a small deep part of the proximal and distal part of the wound open for drainage purposes.  Subcu was closed with #1 Vicryl after we injected 20 mL of Exparel into the soft tissue. The wound then was closed in usual fashion.  Sterile dressings were applied.  He had 3 g of IV Ancef preop.          ______________________________ Georges Lynch. Darrelyn Hillock, M.D.     RAG/MEDQ  D:  10/12/2013  T:  10/12/2013  Job:  [id number]

## 2013-10-12 NOTE — Transfer of Care (Signed)
Immediate Anesthesia Transfer of Care Note  Patient: Keith [name]  Procedure(s) Performed: Procedure(s) (LRB): REDO HEMI-LAMINECTOMY MICRODISCECTOMY L5-S1 ON LEFT  (Left)  Patient Location: PACU  Anesthesia Type: General  Level of Consciousness: sedated, patient cooperative and responds to stimulation  Airway & Oxygen Therapy: Patient Spontanous Breathing and Patient connected to face mask oxgen  Post-op Assessment: Report given to PACU RN and Post -op Vital signs reviewed and stable  Post vital signs: Reviewed and stable  Complications: No apparent anesthesia complications

## 2013-10-13 ENCOUNTER — Encounter (HOSPITAL_COMMUNITY): Payer: Self-pay | Admitting: Orthopedic Surgery

## 2013-10-13 MED ORDER — METHOCARBAMOL 500 MG PO TABS: 500.0000 mg | ORAL_TABLET | Freq: Four times a day (QID) | ORAL | Status: DC | PRN

## 2013-10-13 MED ORDER — OXYCODONE-ACETAMINOPHEN 5-325 MG PO TABS: 1.0000 | ORAL_TABLET | ORAL | Status: DC | PRN

## 2013-10-13 NOTE — Anesthesia Postprocedure Evaluation (Signed)
  Anesthesia Post-op Note  Patient: Keith [name]  Procedure(s) Performed: Procedure(s) (LRB): REDO HEMI-LAMINECTOMY MICRODISCECTOMY L5-S1 ON LEFT  (Left)  Patient Location: PACU  Anesthesia Type: General  Level of Consciousness: awake and alert   Airway and Oxygen Therapy: Patient Spontanous Breathing  Post-op Pain: mild  Post-op Assessment: Post-op Vital signs reviewed, Patient's Cardiovascular Status Stable, Respiratory Function Stable, Patent Airway and No signs of Nausea or vomiting  Last Vitals:  Filed Vitals:   10/13/13 0548  BP: 113/61  Pulse: 66  Temp: 36.5 C  Resp: 16    Post-op Vital Signs: stable   Complications: No apparent anesthesia complications

## 2013-10-13 NOTE — Progress Notes (Signed)
   CARE MANAGEMENT NOTE 10/13/2013  Patient:  Keith [name],Keith [name]   Account Number:  [id number]  Date Initiated:  10/13/2013  Documentation initiated by:  [facility]Danyl Deems  Subjective/Objective Assessment:   HEMI-LAMINECTOMY MICRODISCECTOMY L5-S1 ON LEFT (     Action/Plan:   No DME or HH needed.   Anticipated DC Date:  10/13/2013   Anticipated DC Plan:  HOME/SELF CARE      DC Planning Services  CM consult      Choice offered to / List presented to:             Status of service:  Completed, signed off Medicare Important Message given?   (If response is "NO", the following Medicare IM given date fields will be blank) Date Medicare IM given:   Date Additional Medicare IM given:    Discharge Disposition:  HOME/SELF CARE  Per UR Regulation:  Reviewed for med. necessity/level of care/duration of stay  If discussed at Long Length of Stay Meetings, dates discussed:    Comments:  10/13/2013 1200 UR completed. Isidoro DonningAlesia Meriel Kelliher RN CCM Case Mgmt phone [phone]

## 2013-10-13 NOTE — Evaluation (Signed)
Physical Therapy Evaluation Patient Details Name: Keith [name] MRN: [id number] DOB: 1976-04-11 Today's Date: 10/13/2013 Time: 0850-0902 PT Time Calculation (min): 12 min  PT Assessment / Plan / Recommendation History of Present Illness  S/P REDO HEMI-LAMINECTOMY MICRODISCECTOMY L5-S1 ON LEFT    Clinical Impression  Patient evaluated by Physical Therapy with no further acute PT needs identified. All education has been completed and the patient has no further questions.  See below for any follow-up Physial Therapy or equipment needs. PT is signing off. Thank you for this referral.  Pt ambulating in hallway and practiced safe stair technique.  Pt also educated on back precautions.  Pt feels ready to d/c home.     PT Assessment  Patent does not need any further PT services    Follow Up Recommendations  No PT follow up    Does the patient have the potential to tolerate intense rehabilitation      Barriers to Discharge        Equipment Recommendations  None recommended by PT    Recommendations for Other Services     Frequency      Precautions / Restrictions Precautions Precautions: Back Precaution Comments: educated on back precautions Restrictions Weight Bearing Restrictions: No   Pertinent Vitals/Pain States no pain with ambulation      Mobility  Bed Mobility Overal bed mobility: Needs Assistance Bed Mobility: Supine to Sit Supine to sit: Supervision General bed mobility comments: verbal cues for log roll technique, pt reports this being familiar Transfers Overall transfer level: Needs assistance Equipment used: None Transfers: Sit to/from Stand Sit to Stand: Supervision General transfer comment: verbal cues for straight back, perform squat for descent Ambulation/Gait Ambulation/Gait assistance: Supervision Ambulation Distance (Feet): 400 Feet Assistive device: None Gait Pattern/deviations: Step-through pattern;Trunk flexed General Gait Details: verbal cues  for improved posture, straight back Stairs: Yes Stairs assistance: Supervision Stair Management: Step to pattern;One rail Right Number of Stairs: 4 General stair comments: verbal cues for sequence, no difficulty with steps observed    Exercises     PT Diagnosis:    PT Problem List:   PT Treatment Interventions:       PT Goals(Current goals can be found in the care plan section) Acute Rehab PT Goals PT Goal Formulation: No goals set, d/c therapy  Visit Information  Last PT Received On: 10/13/13 Assistance Needed: +1 History of Present Illness: S/P REDO HEMI-LAMINECTOMY MICRODISCECTOMY L5-S1 ON LEFT         Prior Functioning  Home Living Family/patient expects to be discharged to:: Private residence Living Arrangements: Alone Available Help at Discharge: Family Type of Home: House Home Access: Stairs to enter Secretary/administratorntrance Stairs-Number of Steps: 1 Entrance Stairs-Rails: None Home Layout: One level Home Equipment: None Prior Function Level of Independence: Independent Comments: Engineer, manufacturing systemsfirefighter Communication Communication: No difficulties    Cognition  Cognition Arousal/Alertness: Awake/alert Behavior During Therapy: WFL for tasks assessed/performed Overall Cognitive Status: Within Functional Limits for tasks assessed    Extremity/Trunk Assessment Lower Extremity Assessment Lower Extremity Assessment: Overall WFL for tasks assessed (denies numbness, tingling, able to perform ankle pumps)   Balance    End of Session PT - End of Session Activity Tolerance: Patient tolerated treatment well Patient left: in chair;with call bell/phone within reach;with family/visitor present  GP Functional Assessment Tool Used: clinical judgement Functional Limitation: Mobility: Walking and moving around Mobility: Walking and Moving Around Current Status [phone](G8978): At least 1 percent but less than 20 percent impaired, limited or restricted Mobility: Walking and Moving  Around Goal Status  [phone]): 0 percent impaired, limited or restricted Mobility: Walking and Moving Around Discharge Status [phone]): 0 percent impaired, limited or restricted   Shakira Los,KATHrine E 10/13/2013, 9:43 AM Zenovia Jarred, PT, DPT 10/13/2013 Pager: [phone]

## 2013-10-13 NOTE — Evaluation (Signed)
Occupational Therapy Evaluation Patient Details Name: Keith [name] MRN: [id number] DOB: Oct 18, 1975 Today's Date: 10/13/2013 Time: 4098-1191 OT Time Calculation (min): 19 min  OT Assessment / Plan / Recommendation History of present illness S/P REDO HEMI-LAMINECTOMY MICRODISCECTOMY L5-S1 ON LEFT     Clinical Impression   Pt doing well and is overall at supervision/min guard for functional mobility but mod assist with LB ADL as he is unable to cross LEs up for tasks and is not allowed to bend forward with precautions. Pt states family will help with LB self care as he declines AE at this time. All education completed.    OT Assessment  Patient does not need any further OT services    Follow Up Recommendations  No OT follow up    Barriers to Discharge      Equipment Recommendations  None recommended by OT    Recommendations for Other Services    Frequency       Precautions / Restrictions Precautions Precautions: Back Precaution Comments: Issued back care handout and reviewed all precautions related to ADL. pt able to state all precautions on his own. Restrictions Weight Bearing Restrictions: No   Pertinent Vitals/Pain 110 back; reposition, rest    ADL  Eating/Feeding: Independent Where Assessed - Eating/Feeding: Chair Grooming: Wash/dry hands;Supervision/safety Where Assessed - Grooming: Unsupported standing Upper Body Bathing: Chest;Right arm;Left arm;Abdomen;Set up;Supervision/safety Where Assessed - Upper Body Bathing: Unsupported sitting Lower Body Bathing: Moderate assistance (without AE) Where Assessed - Lower Body Bathing: Supported sit to stand Upper Body Dressing: Set up;Supervision/safety Where Assessed - Upper Body Dressing: Unsupported sitting Lower Body Dressing: Minimal assistance (without AE) Where Assessed - Lower Body Dressing: Supported sit to Pharmacist, hospital: Banker: Comfort height toilet;Grab  bars Toileting - Architect and Hygiene: Supervision/safety Where Assessed - Engineer, mining and Hygiene: Sit on 3-in-1 or toilet Tub/Shower Transfer: Min guard Equipment Used: Long-handled shoe horn;Long-handled sponge;Reacher;Sock aid ADL Comments: Introduced all AE and demonstrated as pt unable to cross LEs up for dressing. Pt states his dad will hep with LB dressing until he doesnt have to follow precautions per MD. Assisted with starting pants over his LEs today and pt able to stand and pull up. Suggested elastic waistband pants to make it easier to grip to thiighs so they dont fall down when he stands and for comfort to sit in. He did well with higher toilet and has a vanity next to toilet  Practiced side step over like a tub edge.    OT Diagnosis:    OT Problem List:   OT Treatment Interventions:     OT Goals(Current goals can be found in the care plan section) Acute Rehab OT Goals Patient Stated Goal: home  Visit Information  Last OT Received On: 10/13/13 Assistance Needed: +1 History of Present Illness: S/P REDO HEMI-LAMINECTOMY MICRODISCECTOMY L5-S1 ON LEFT         Prior Functioning     Home Living Family/patient expects to be discharged to:: Private residence Living Arrangements: Alone Available Help at Discharge: Family Type of Home: House Home Access: Stairs to enter Secretary/administrator of Steps: 1 Entrance Stairs-Rails: None Home Layout: One level Home Equipment: Bedside commode Prior Function Level of Independence: Independent Comments: firefighter; 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 Communication Communication: No difficulties         Vision/Perception     Cognition  Cognition Arousal/Alertness: Awake/alert Behavior During Therapy: [facility]  for tasks assessed/performed Overall Cognitive Status: Within Functional Limits for tasks assessed    Extremity/Trunk Assessment Upper  Extremity Assessment Upper Extremity Assessment: Overall WFL for tasks assessed     Mobility  Transfers Overall transfer level: Needs assistance Equipment used: None Transfers: Sit to/from Stand Sit to Stand: Supervision General transfer comment: verbal cues for back precautions     Exercise     Balance     End of Session OT - End of Session Activity Tolerance: Patient tolerated treatment well Patient left: in chair;with call bell/phone within reach;with nursing/sitter in room  GO Functional Assessment Tool Used: clinical judgement Functional Limitation: Self care Self Care Current Status ([id number](G8987): At least 20 percent but less than 40 percent impaired, limited or restricted Self Care Goal Status ([id number](G8988): At least 20 percent but less than 40 percent impaired, limited or restricted Self Care Discharge Status [phone](G8989): At least 20 percent but less than 40 percent impaired, limited or restricted   Lennox LaityStone, [name] [phone] 10/13/2013, 11:39 AM

## 2013-10-13 NOTE — Progress Notes (Signed)
   Subjective: 1 Day Post-Op Procedure(s) (LRB): REDO HEMI-LAMINECTOMY MICRODISCECTOMY L5-S1 ON LEFT  (Left) Patient reports pain as mild.   Patient seen in rounds without Dr. Darrelyn HillockGioffre. Patient is well, and has had no acute complaints or problems. He reports that he has minimal pain, just soreness in his back. No pain in his legs. He is anxious to get up and get moving. No issues overnight. No SOB or chest pain.  Plan is to go Home after hospital stay.  Objective: Vital signs in last 24 hours: Temp:  [97.3 F (36.3 C)-97.9 F (36.6 C)] 97.7 F (36.5 C) (03/19 0548) Pulse Rate:  [63-83] 66 (03/19 0548) Resp:  [9-18] 16 (03/19 0548) BP: (100-140)/(61-84) 113/61 mmHg (03/19 0548) SpO2:  [93 %-100 %] 95 % (03/19 0548) FiO2 (%):  [2 %] 2 % (03/18 1130) Weight:  [121.564 kg (268 lb)] 121.564 kg (268 lb) (03/18 1130)  Intake/Output from previous day:  Intake/Output Summary (Last 24 hours) at 10/13/13 0719 Last data filed at 10/13/13 0600  Gross per 24 hour  Intake 3613.66 ml  Output   2375 ml  Net 1238.66 ml     Recent Labs  10/12/13 0705  NA 138  K 4.0  CL 101  CO2 24  BUN 16  CREATININE 0.94  GLUCOSE 121*  CALCIUM 9.7    Recent Labs  10/12/13 0705  INR 0.94    EXAM General - Patient is Alert and Oriented Extremity - Neurologically intact Intact pulses distally Dorsiflexion/Plantar flexion intact Dressing/Incision - clean, dry Motor Function - intact, moving foot and toes well on exam.   Past Medical History  Diagnosis Date  . Heartburn   . Arthritis   . Back pain with sciatica 10/04/13    left leg    Assessment/Plan: 1 Day Post-Op Procedure(s) (LRB): REDO HEMI-LAMINECTOMY MICRODISCECTOMY L5-S1 ON LEFT  (Left) Active Problems:   Spinal stenosis, lumbar region, with neurogenic claudication   Herniated lumbar intervertebral disc  Estimated body mass index is 37.39 kg/(m^2) as calculated from the following:   Height as of this encounter: 5\' 11"  (1.803  m).   Weight as of this encounter: 121.564 kg (268 lb). Advance diet Up with therapy D/C IV fluids Discharge home   DVT Prophylaxis - Aspirin Weight-Bearing as tolerated  He is doing excellent. Will work with therapy this morning and is prepared for discharge after. Discharge instructions discussed. Plan for follow up in office in 2 weeks.   Shafiq Larch LAUREN 10/13/2013, 7:19 AM

## 2013-10-13 NOTE — Plan of Care (Signed)
Problem: Consults Goal: Diagnosis - Spinal Surgery Outcome: Completed/Met Date Met:  10/13/13 Lumbar Laminectomy (Complex) L5-S1

## 2013-10-13 NOTE — Discharge Instructions (Signed)
Change your dressing daily. Shower only, no tub bath. You may shower starting Friday.  Before showering, remove dressing and place a piece of saran wrap over the incision.  After showering, remove saran wrap and apply new dressing. You only need to use the saran wrap before showering through the weekend. Call if any temperatures greater than 101 or any wound complications: [phone] during the day and ask for Dr. Jeannetta EllisGioffre's nurse, Mackey Birchwoodammy Johnson.

## 2013-10-17 NOTE — Discharge Summary (Signed)
Physician Discharge Summary   Patient ID: Keith [name] MRN: [id number] DOB/AGE: 38-Feb-1977 38 y.o.  Admit date: 10/12/2013 Discharge date: 10/13/2013  Primary Diagnosis: Lumbar disc herniation  Admission Diagnoses:  Past Medical History  Diagnosis Date  . Heartburn   . Arthritis   . Back pain with sciatica 10/04/13    left leg   Discharge Diagnoses:   Active Problems:   Spinal stenosis, lumbar region, with neurogenic claudication   Herniated lumbar intervertebral disc  Estimated body mass index is 37.39 kg/(m^2) as calculated from the following:   Height as of this encounter: 5' 11"  (1.803 m).   Weight as of this encounter: 121.564 kg (268 lb).  Procedure:  Procedure(s) (LRB): REDO HEMI-LAMINECTOMY MICRODISCECTOMY L5-S1 ON LEFT  (Left)   Consults: None  HPI: The patient is a 38 year old male who presents with back pain. The patient reports low back symptoms including low back pain which began two months ago following a specific injury. The injury occurred at home due to a fall in the shower. Symptoms include pain, numbness, burning and stiffness. The pain radiates to the left buttock, left thigh, left lower leg and left foot. The patient describes the pain as aching and throbbing. The patient describes the severity of their symptoms as moderate in severity. Symptoms are exacerbated by standing, lifting and bending. MRI showed a previous hemilaminectomy at L5-S1. He has a disk herniation which is posterior lateral extrusion and displaces the S1 nerve root.   Laboratory Data: Admission on 10/12/2013, Discharged on 10/13/2013  Component Date Value Ref Range Status  . Sodium 10/12/2013 138  137 - 147 mEq/L Final  . Potassium 10/12/2013 4.0  3.7 - 5.3 mEq/L Final  . Chloride 10/12/2013 101  96 - 112 mEq/L Final  . CO2 10/12/2013 24  19 - 32 mEq/L Final  . Glucose, Bld 10/12/2013 121* 70 - 99 mg/dL Final  . BUN 10/12/2013 16  6 - 23 mg/dL Final  . Creatinine, Ser 10/12/2013  0.94  0.50 - 1.35 mg/dL Final  . Calcium 10/12/2013 9.7  8.4 - 10.5 mg/dL Final  . Total Protein 10/12/2013 7.0  6.0 - 8.3 g/dL Final  . Albumin 10/12/2013 3.7  3.5 - 5.2 g/dL Final  . AST 10/12/2013 32  0 - 37 U/L Final  . ALT 10/12/2013 58* 0 - 53 U/L Final  . Alkaline Phosphatase 10/12/2013 58  39 - 117 U/L Final  . Total Bilirubin 10/12/2013 0.4  0.3 - 1.2 mg/dL Final  . GFR calc non Af Amer 10/12/2013 >90  >90 mL/min Final  . GFR calc Af Amer 10/12/2013 >90  >90 mL/min Final   Comment: (NOTE)                          The eGFR has been calculated using the CKD EPI equation.                          This calculation has not been validated in all clinical situations.                          eGFR's persistently <90 mL/min signify possible Chronic Kidney                          Disease.  Marland Kitchen Prothrombin Time 10/12/2013 12.4  11.6 - 15.2 seconds Final  .  INR 10/12/2013 0.94  0.00 - 1.49 Final  . aPTT 10/12/2013 26  24 - 37 seconds Final  . Color, Urine 10/12/2013 YELLOW  YELLOW Final  . APPearance 10/12/2013 CLEAR  CLEAR Final  . Specific Gravity, Urine 10/12/2013 1.024  1.005 - 1.030 Final  . pH 10/12/2013 5.5  5.0 - 8.0 Final  . Glucose, UA 10/12/2013 NEGATIVE  NEGATIVE mg/dL Final  . Hgb urine dipstick 10/12/2013 NEGATIVE  NEGATIVE Final  . Bilirubin Urine 10/12/2013 NEGATIVE  NEGATIVE Final  . Ketones, ur 10/12/2013 NEGATIVE  NEGATIVE mg/dL Final  . Protein, ur 10/12/2013 NEGATIVE  NEGATIVE mg/dL Final  . Urobilinogen, UA 10/12/2013 0.2  0.0 - 1.0 mg/dL Final  . Nitrite 10/12/2013 NEGATIVE  NEGATIVE Final  . Leukocytes, UA 10/12/2013 NEGATIVE  NEGATIVE Final   MICROSCOPIC NOT DONE ON URINES WITH NEGATIVE PROTEIN, BLOOD, LEUKOCYTES, NITRITE, OR GLUCOSE <1000 mg/dL.  Hospital Outpatient Visit on 10/04/2013  Component Date Value Ref Range Status  . WBC 10/04/2013 7.1  4.0 - 10.5 K/uL Final  . RBC 10/04/2013 4.38  4.22 - 5.81 MIL/uL Final  . Hemoglobin 10/04/2013 14.2  13.0 - 17.0  g/dL Final  . HCT 10/04/2013 41.4  39.0 - 52.0 % Final  . MCV 10/04/2013 94.5  78.0 - 100.0 fL Final  . MCH 10/04/2013 32.4  26.0 - 34.0 pg Final  . MCHC 10/04/2013 34.3  30.0 - 36.0 g/dL Final  . RDW 10/04/2013 12.6  11.5 - 15.5 % Final  . Platelets 10/04/2013 303  150 - 400 K/uL Final  . MRSA, PCR 10/04/2013 NEGATIVE  NEGATIVE Final  . Staphylococcus aureus 10/04/2013 NEGATIVE  NEGATIVE Final   Comment:                                 The Xpert SA Assay (FDA                          approved for NASAL specimens                          in patients over 1 years of age),                          is one component of                          a comprehensive surveillance                          program.  Test performance has                          been validated by [employer] for patients greater                          than or equal to 44 year old.                          It is not intended  to diagnose infection nor to                          guide or monitor treatment.     X-Rays:Dg Chest 2 View  10/12/2013   CLINICAL DATA:  Preoperative evaluation  EXAM: CHEST  2 VIEW  COMPARISON:  January 10, 2010  FINDINGS: Lungs are clear. Heart size and pulmonary vascularity are normal. No adenopathy. No bone lesions.  IMPRESSION: No abnormality noted.   Electronically Signed   By: Lowella Grip M.D.   On: 10/12/2013 08:17   Dg Spine Portable 1 View  10/12/2013   CLINICAL DATA:  Intraoperative image number 2.  L5-S1 decompression.  EXAM: PORTABLE SPINE - 1 VIEW  COMPARISON:  DG SPINE PORTABLE 1 VIEW dated 10/12/2013 at 9:07 a.m.  FINDINGS: Tissue spreaders in place at L5-S1 with a curved tip probe oriented towards the L5-S1 intervertebral disc.  IMPRESSION: 1. L5-S1 localization.   Electronically Signed   By: Sherryl Barters M.D.   On: 10/12/2013 09:40   Dg Spine Portable 1 View  10/12/2013   CLINICAL DATA:  L5-S1 decompression  EXAM:  PORTABLE SPINE - 1 VIEW  COMPARISON:  06/06/2009.  MRI 05/29/2009  FINDINGS: L5-S1 as the lowest disc space, consistent with prior labeling.  There are two needles in the soft tissues of the back. 1 needle is directed at the L5 spinous process. The second needle is directed between the L5 and S1 spinous processes.  Limited detail due to underpenetration.  IMPRESSION: Lumbar localization as above.   Electronically Signed   By: Franchot Gallo M.D.   On: 10/12/2013 09:17     Hospital Course: Keith [name] is a 38 y.o. who was admitted to [facility]. They were brought to the operating room on 10/12/2013 and underwent Procedure(s): REDO HEMI-LAMINECTOMY MICRODISCECTOMY L5-S1 ON LEFT .  Patient tolerated the procedure well and was later transferred to the recovery room and then to the orthopaedic floor for postoperative care.  They were given PO and IV analgesics for pain control following their surgery.  They were given 24 hours of postoperative antibiotics of  Anti-infectives   Start     Dose/Rate Route Frequency Ordered Stop   10/12/13 1600  ceFAZolin (ANCEF) IVPB 1 g/50 mL premix     1 g 100 mL/hr over 30 Minutes Intravenous Every 8 hours 10/12/13 1144 10/13/13 0848   10/12/13 0914  polymyxin B 500,000 Units, bacitracin 50,000 Units in sodium chloride irrigation 0.9 % 500 mL irrigation  Status:  Discontinued       As needed 10/12/13 0915 10/12/13 1025   10/12/13 0600  ceFAZolin (ANCEF) 3 g in dextrose 5 % 50 mL IVPB     3 g 160 mL/hr over 30 Minutes Intravenous On call to O.R. 10/11/13 1708 10/12/13 0850     and started on DVT prophylaxis in the form of Aspirin.   PT was ordered.  Discharge planning consulted to help with postop disposition and equipment needs.  Patient had a good night on the evening of surgery.  They started to get up OOB with therapy on day one.  Patient was seen in rounds and was ready to go home.   Discharge Medications: Prior to Admission medications     Medication Sig Start Date End Date Taking? Authorizing Provider  methocarbamol (ROBAXIN) 500 MG tablet Take 1 tablet (500 mg total) by mouth every 6 (six) hours as needed for muscle spasms.  10/13/13   River Mckercher Renelda Loma, PA-C  oxyCODONE-acetaminophen (PERCOCET/ROXICET) 5-325 MG per tablet Take 1-2 tablets by mouth every 4 (four) hours as needed for moderate pain. 10/13/13   Darroll Bredeson Renelda Loma, PA-C    Diet: Regular diet Activity:WBAT Follow-up:in 2 weeks Disposition - Home Discharged Condition: good   Discharge Orders   Future Orders Complete By Expires   Call MD / Call 911  As directed    Comments:     If you experience chest pain or shortness of breath, CALL 911 and be transported to the hospital emergency room.  If you develope a fever above 101 F, pus (white drainage) or increased drainage or redness at the wound, or calf pain, call your surgeon's office.   Constipation Prevention  As directed    Comments:     Drink plenty of fluids.  Prune juice may be helpful.  You may use a stool softener, such as Colace (over the counter) 100 mg twice a day.  Use MiraLax (over the counter) for constipation as needed.   Diet general  As directed    Discharge instructions  As directed    Comments:     Change your dressing daily. Shower only, no tub bath. You may shower starting Friday.  Before showering, remove dressing and place a piece of saran wrap over the incision.  After showering, remove saran wrap and apply new dressing. You only need to use the saran wrap before showering through the weekend. Call if any temperatures greater than 101 or any wound complications: [phone] during the day and ask for Dr. Charlestine Night nurse, Brunilda Payor.   Driving restrictions  As directed    Comments:     No driving while on pain medications   Increase activity slowly as tolerated  As directed    Lifting restrictions  As directed    Comments:     No lifting       Medication List    STOP  taking these medications       naproxen sodium 220 MG tablet  Commonly known as:  ANAPROX      TAKE these medications       methocarbamol 500 MG tablet  Commonly known as:  ROBAXIN  Take 1 tablet (500 mg total) by mouth every 6 (six) hours as needed for muscle spasms.     oxyCODONE-acetaminophen 5-325 MG per tablet  Commonly known as:  PERCOCET/ROXICET  Take 1-2 tablets by mouth every 4 (four) hours as needed for moderate pain.           Follow-up Information   Follow up with GIOFFRE,RONALD A, MD. Schedule an appointment as soon as possible for a visit in 2 weeks.   Specialty:  Orthopedic Surgery   Contact information:   [address] [place] [address] [phone]       Signed: Malachy Chamber 10/17/2013, 11:39 AM

## 2014-04-26 ENCOUNTER — Emergency Department (HOSPITAL_COMMUNITY): Payer: Worker's Compensation

## 2014-04-26 ENCOUNTER — Encounter (HOSPITAL_COMMUNITY): Payer: Self-pay | Admitting: Emergency Medicine

## 2014-04-26 ENCOUNTER — Emergency Department (HOSPITAL_COMMUNITY)
Admission: EM | Admit: 2014-04-26 | Discharge: 2014-04-27 | Disposition: A | Discharge status: Home / Self Care | Payer: Worker's Compensation | Attending: Emergency Medicine | Admitting: Emergency Medicine

## 2014-04-26 DIAGNOSIS — R5381 Other malaise: Secondary | ICD-10-CM | POA: Diagnosis present

## 2014-04-26 DIAGNOSIS — R42 Dizziness and giddiness: Secondary | ICD-10-CM | POA: Insufficient documentation

## 2014-04-26 DIAGNOSIS — R5383 Other fatigue: Secondary | ICD-10-CM | POA: Diagnosis present

## 2014-04-26 DIAGNOSIS — Z720 Tobacco use: Secondary | ICD-10-CM

## 2014-04-26 DIAGNOSIS — F17200 Nicotine dependence, unspecified, uncomplicated: Secondary | ICD-10-CM | POA: Diagnosis not present

## 2014-04-26 DIAGNOSIS — E669 Obesity, unspecified: Secondary | ICD-10-CM | POA: Diagnosis not present

## 2014-04-26 DIAGNOSIS — M129 Arthropathy, unspecified: Secondary | ICD-10-CM | POA: Diagnosis not present

## 2014-04-26 DIAGNOSIS — F17210 Nicotine dependence, cigarettes, uncomplicated: Secondary | ICD-10-CM | POA: Insufficient documentation

## 2014-04-26 DIAGNOSIS — M1990 Unspecified osteoarthritis, unspecified site: Secondary | ICD-10-CM | POA: Insufficient documentation

## 2014-04-26 LAB — CBC
HEMATOCRIT: 40.3 % (ref 39.0–52.0)
HEMOGLOBIN: 14 g/dL (ref 13.0–17.0)
MCH: 32.7 pg (ref 26.0–34.0)
MCHC: 34.7 g/dL (ref 30.0–36.0)
MCV: 94.2 fL (ref 78.0–100.0)
Platelets: 271 10*3/uL (ref 150–400)
RBC: 4.28 MIL/uL (ref 4.22–5.81)
RDW: 12.5 % (ref 11.5–15.5)
WBC: 9 10*3/uL (ref 4.0–10.5)

## 2014-04-26 LAB — I-STAT CHEM 8, ED
BUN: 14 mg/dL (ref 6–23)
CALCIUM ION: 1.2 mmol/L (ref 1.12–1.23)
Chloride: 107 mEq/L (ref 96–112)
Creatinine, Ser: 1.1 mg/dL (ref 0.50–1.35)
GLUCOSE: 119 mg/dL — AB (ref 70–99)
HCT: 44 % (ref 39.0–52.0)
HEMOGLOBIN: 15 g/dL (ref 13.0–17.0)
Potassium: 4 mEq/L (ref 3.7–5.3)
Sodium: 140 mEq/L (ref 137–147)
TCO2: 22 mmol/L (ref 0–100)

## 2014-04-26 LAB — BASIC METABOLIC PANEL
Anion gap: 15 (ref 5–15)
BUN: 14 mg/dL (ref 6–23)
CALCIUM: 9.3 mg/dL (ref 8.4–10.5)
CO2: 22 mEq/L (ref 19–32)
CREATININE: 1.07 mg/dL (ref 0.50–1.35)
Chloride: 104 mEq/L (ref 96–112)
GFR calc Af Amer: >90 mL/min (ref >90)
GFR, EST NON AFRICAN AMERICAN: 86 mL/min — AB (ref >90)
Glucose, Bld: 117 mg/dL — ABNORMAL HIGH (ref 70–99)
Potassium: 4.1 mEq/L (ref 3.7–5.3)
SODIUM: 141 meq/L (ref 137–147)

## 2014-04-26 LAB — URINALYSIS, ROUTINE W REFLEX MICROSCOPIC
BILIRUBIN URINE: NEGATIVE
GLUCOSE, UA: NEGATIVE mg/dL
Hgb urine dipstick: NEGATIVE
KETONES UR: NEGATIVE mg/dL
Leukocytes, UA: NEGATIVE
Nitrite: NEGATIVE
Protein, ur: NEGATIVE mg/dL
SPECIFIC GRAVITY, URINE: 1.025 (ref 1.005–1.030)
Urobilinogen, UA: 0.2 mg/dL (ref 0.0–1.0)
pH: 5.5 (ref 5.0–8.0)

## 2014-04-26 LAB — I-STAT TROPONIN, ED: Troponin i, poc: 0 ng/mL (ref 0.00–0.08)

## 2014-04-26 NOTE — ED Notes (Signed)
Pt co of mild nausea and generalized weakness. Pt is very lethargic but easily aroused. Pt went on call to home and he states that there were a lot of fumes and he did not feel right after. He states that he became very lightheaded and nauseous.

## 2014-04-26 NOTE — ED Provider Notes (Signed)
CSN: [id number]     Arrival date & time 04/26/14  2140 History   First MD Initiated Contact with Patient 04/26/14 2212     Chief Complaint  Patient presents with  . Fatigue     (Consider location/radiation/quality/duration/timing/severity/associated sxs/prior Treatment) HPI  Keith [name] is a 38 y.o. 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 The scene was cleared: There was no gas leak, home owners misinterpreted the smell which was actually heals pains which they used today. After she left the scene patient reports feeling like his eyes are heavy, he is weak and lightheaded. No other people were affected similarly that were exposed. Patient denies cough, chest pain, shortness of breath, abdominal pain. On review of systems he endorses nausea. No other people in his group are ill.   Past Medical History  Diagnosis Date  . Heartburn   . Arthritis   . Back pain with sciatica 10/04/13    left leg   Past Surgical History  Procedure Laterality Date  . Knee surgery    . Foot surgery    . Back surgery      lumbar x 2  . Cholecystectomy    . Hemi-microdiscectomy lumbar laminectomy level 1 Left 10/12/2013    Procedure: REDO HEMI-LAMINECTOMY MICRODISCECTOMY L5-S1 ON LEFT ;  Surgeon: Jacki Cones, MD;  Location: [facility];  Service: Orthopedics;  Laterality: Left;   No family history on file. History  Substance Use Topics  . Smoking status: Current Every Day Smoker -- 0.50 packs/day for 22 years  . Smokeless tobacco: Never Used  . Alcohol Use: Yes     Comment: occasional    Review of Systems  10 systems reviewed and found to be negative, except as noted in the HPI.   Allergies  Review of patient's allergies indicates no known allergies.  Home Medications   Prior to Admission medications   Medication Sig Start Date End Date Taking? Authorizing Provider  ibuprofen (ADVIL,MOTRIN) 200 MG tablet Take 400-600 mg by mouth  every 6 (six) hours as needed for mild pain.   Yes Historical Provider, MD   BP 114/66  Pulse 62  Temp(Src) 98.2 F (36.8 C) (Oral)  Resp 18  Ht 5\' 11"  (1.803 m)  Wt 280 lb (127.007 kg)  BMI 39.07 kg/m2  SpO2 100% Physical Exam  Nursing note and vitals reviewed. Constitutional: He is oriented to person, place, and time. He appears well-developed and well-nourished. No distress.  Obese  Drowsy but rousable to voice  HENT:  Head: Normocephalic and atraumatic.  Mouth/Throat: Oropharynx is clear and moist.  Eyes: Conjunctivae and EOM are normal. Pupils are equal, round, and reactive to light.  Neck: Normal range of motion. Neck supple.  Cardiovascular: Normal rate, regular rhythm and intact distal pulses.   Pulmonary/Chest: Effort normal and breath sounds normal. No stridor. No respiratory distress. He has no wheezes. He has no rales. He exhibits no tenderness.  Abdominal: Soft. Bowel sounds are normal. He exhibits no distension and no mass. There is no tenderness. There is no rebound and no guarding.  Musculoskeletal: Normal range of motion. He exhibits no edema and no tenderness.  Neurological: He is alert and oriented to person, place, and time.  Follows commands, Clear, goal oriented speech, Strength is 5 out of 5x4 extremities, patient ambulates with a coordinated in nonantalgic gait. Sensation is grossly intact.   Psychiatric: He has a normal mood and affect.  ED Course  Procedures (including critical care time) Labs Review Labs Reviewed  BASIC METABOLIC PANEL - Abnormal; Notable for the following:    Glucose, Bld 117 (*)    GFR calc non Af Amer 86 (*)    All other components within normal limits  CARBOXYHEMOGLOBIN - Abnormal; Notable for the following:    Carboxyhemoglobin 2.4 (*)    All other components within normal limits  I-STAT CHEM 8, ED - Abnormal; Notable for the following:    Glucose, Bld 119 (*)    All other components within normal limits  CBC   URINALYSIS, ROUTINE W REFLEX MICROSCOPIC  I-STAT TROPOININ, ED    Imaging Review Dg Chest 2 View  04/26/2014   CLINICAL DATA:  Fatigue  EXAM: CHEST  2 VIEW  COMPARISON:  10/12/2013  FINDINGS: Lungs are essentially clear. No focal consolidation. No pleural effusion or pneumothorax.  The heart is normal in size.  Visualized osseous structures are within normal limits.  IMPRESSION: No evidence of acute cardiopulmonary disease.   Electronically Signed   By: Charline BillsSriyesh  Krishnan M.D.   On: 04/26/2014 22:51     EKG Interpretation   Date/Time:  Wednesday April 26 2014 21:55:33 EDT Ventricular Rate:  90 PR Interval:  151 QRS Duration: 71 QT Interval:  357 QTC Calculation: 437 R Axis:   37 Text Interpretation:  Sinus rhythm Low voltage, precordial leads No  significant change was found Confirmed by CAMPOS  MD, KEVIN ([id number]) on  04/27/2014 2:05:20 AM      MDM   Final diagnoses:  Other fatigue  Tobacco use    Filed Vitals:   04/27/14 0000 04/27/14 0030 04/27/14 0045 04/27/14 0118  BP: 124/106 124/94 133/91 114/66  Pulse: 80 64 63 62  Temp:      TempSrc:      Resp: 19 13 16 18   Height:      Weight:      SpO2: 97% 100% 100% 100%    Keith [name] is a 38 y.o. male presenting with fatigue after entering home that had fumes. Patient's neuro exam is nonfocal, he is saturating well on room air. Posterior pharynx is not erythematous. Normal chest x-ray and blood work. Carboxyhemoglobin level is mildly elevated at 2.4 however patient is a heavy smoker. Ambulates shortness of breath, amenable to discharge  Evaluation does not show pathology that would require ongoing emergent intervention or inpatient treatment. Pt is hemodynamically stable and mentating appropriately. Discussed findings and plan with patient/guardian, who agrees with care plan. All questions answered. Return precautions discussed and outpatient follow up given.       Keith Emeryicole Zeniyah Peaster, PA-C 04/27/14 [phone]

## 2014-04-26 NOTE — ED Notes (Signed)
[place] and lightheadedness. Some nausea and cold sweats that started after a call in which there were paint fumes. CBG 140. 98% RA 152/92 100. Pt states that his eyes are real heavy and he wants to just sleep.

## 2014-04-27 DIAGNOSIS — R42 Dizziness and giddiness: Secondary | ICD-10-CM | POA: Diagnosis not present

## 2014-04-27 DIAGNOSIS — R5383 Other fatigue: Secondary | ICD-10-CM | POA: Diagnosis present

## 2014-04-27 DIAGNOSIS — F17210 Nicotine dependence, cigarettes, uncomplicated: Secondary | ICD-10-CM | POA: Diagnosis not present

## 2014-04-27 DIAGNOSIS — M1990 Unspecified osteoarthritis, unspecified site: Secondary | ICD-10-CM | POA: Diagnosis not present

## 2014-04-27 DIAGNOSIS — E669 Obesity, unspecified: Secondary | ICD-10-CM | POA: Diagnosis not present

## 2014-04-27 LAB — CARBOXYHEMOGLOBIN
Carboxyhemoglobin: 2.4 % — ABNORMAL HIGH (ref 0.5–1.5)
METHEMOGLOBIN: 1 % (ref 0.0–1.5)
O2 Saturation: 96 %
Total hemoglobin: 14.3 g/dL (ref 13.5–18.0)

## 2014-04-27 NOTE — Discharge Instructions (Signed)
Please follow with your primary care doctor in the next 2 days for a check-up. They must obtain records for further management.  ° °Do not hesitate to return to the Emergency Department for any new, worsening or concerning symptoms.  ° ° °Fatigue °Fatigue is a feeling of tiredness, lack of energy, lack of motivation, or feeling tired all the time. Having enough rest, good nutrition, and reducing stress will normally reduce fatigue. Consult your caregiver if it persists. The nature of your fatigue will help your caregiver to find out its cause. The treatment is based on the cause.  °CAUSES  °There are many causes for fatigue. Most of the time, fatigue can be traced to one or more of your habits or routines. Most causes fit into one or more of three general areas. They are: °Lifestyle problems °· Sleep disturbances. °· Overwork. °· Physical exertion. °· Unhealthy habits. °¨ Poor eating habits or eating disorders. °¨ Alcohol and/or drug use . °¨ Lack of proper nutrition (malnutrition). °Psychological problems °· Stress and/or anxiety problems. °· Depression. °· Grief. °· Boredom. °Medical Problems or Conditions °· Anemia. °· Pregnancy. °· Thyroid gland problems. °· Recovery from major surgery. °· Continuous pain. °· Emphysema or asthma that is not well controlled °· Allergic conditions. °· Diabetes. °· Infections (such as mononucleosis). °· Obesity. °· Sleep disorders, such as sleep apnea. °· Heart failure or other heart-related problems. °· Cancer. °· Kidney disease. °· Liver disease. °· Effects of certain medicines such as antihistamines, cough and cold remedies, prescription pain medicines, heart and blood pressure medicines, drugs used for treatment of cancer, and some antidepressants. °SYMPTOMS  °The symptoms of fatigue include:  °· Lack of energy. °· Lack of drive (motivation). °· Drowsiness. °· Feeling of indifference to the surroundings. °DIAGNOSIS  °The details of how you feel help guide your caregiver in  finding out what is causing the fatigue. You will be asked about your present and past health condition. It is important to review all medicines that you take, including prescription and non-prescription items. A thorough exam will be done. You will be questioned about your feelings, habits, and normal lifestyle. Your caregiver may suggest blood tests, urine tests, or other tests to look for common medical causes of fatigue.  °TREATMENT  °Fatigue is treated by correcting the underlying cause. For example, if you have continuous pain or depression, treating these causes will improve how you feel. Similarly, adjusting the dose of certain medicines will help in reducing fatigue.  °HOME CARE INSTRUCTIONS  °· Try to get the required amount of good sleep every night. °· Eat a healthy and nutritious diet, and drink enough water throughout the day. °· Practice ways of relaxing (including yoga or meditation). °· Exercise regularly. °· Make plans to change situations that cause stress. Act on those plans so that stresses decrease over time. Keep your work and personal routine reasonable. °· Avoid street drugs and minimize use of alcohol. °· Start taking a daily multivitamin after consulting your caregiver. °SEEK MEDICAL CARE IF:  °· You have persistent tiredness, which cannot be accounted for. °· You have fever. °· You have unintentional weight loss. °· You have headaches. °· You have disturbed sleep throughout the night. °· You are feeling sad. °· You have constipation. °· You have dry skin. °· You have gained weight. °· You are taking any new or different medicines that you suspect are causing fatigue. °· You are unable to sleep at night. °· You develop any unusual swelling of   your legs or other parts of your body. °SEEK IMMEDIATE MEDICAL CARE IF:  °· You are feeling confused. °· Your vision is blurred. °· You feel faint or pass out. °· You develop severe headache. °· You develop severe abdominal, pelvic, or back  pain. °· You develop chest pain, shortness of breath, or an irregular or fast heartbeat. °· You are unable to pass a normal amount of urine. °· You develop abnormal bleeding such as bleeding from the rectum or you vomit blood. °· You have thoughts about harming yourself or committing suicide. °· You are worried that you might harm someone else. °MAKE SURE YOU:  °· Understand these instructions. °· Will watch your condition. °· Will get help right away if you are not doing well or get worse. °Document Released: 05/11/2007 Document Revised: 10/06/2011 Document Reviewed: 11/15/2013 °[employer]® Patient Information ©2015 [employer]. This information is not intended to replace advice given to you by your health care provider. Make sure you discuss any questions you have with your health care provider. ° °

## 2014-04-28 NOTE — ED Provider Notes (Signed)
Medical screening examination/treatment/procedure(s) were performed by non-physician practitioner and as supervising physician I was immediately available for consultation/collaboration.   EKG Interpretation   Date/Time:  Wednesday April 26 2014 21:55:33 EDT Ventricular Rate:  90 PR Interval:  151 QRS Duration: 71 QT Interval:  357 QTC Calculation: 437 R Axis:   37 Text Interpretation:  Sinus rhythm Low voltage, precordial leads No  significant change was found Confirmed by Jkayla Spiewak  MD, Caryn BeeKEVIN ([id number]) on  04/27/2014 2:05:20 AM        Lyanne CoKevin M Brittani Purdum, MD 04/28/14 [id number]

## 2015-06-26 ENCOUNTER — Encounter (HOSPITAL_BASED_OUTPATIENT_CLINIC_OR_DEPARTMENT_OTHER): Payer: Self-pay

## 2015-06-26 ENCOUNTER — Emergency Department (HOSPITAL_BASED_OUTPATIENT_CLINIC_OR_DEPARTMENT_OTHER)
Admission: EM | Admit: 2015-06-26 | Discharge: 2015-06-26 | Disposition: A | Discharge status: Home / Self Care | Payer: 59 | Attending: Emergency Medicine | Admitting: Emergency Medicine

## 2015-06-26 DIAGNOSIS — F17200 Nicotine dependence, unspecified, uncomplicated: Secondary | ICD-10-CM | POA: Insufficient documentation

## 2015-06-26 DIAGNOSIS — R51 Headache: Secondary | ICD-10-CM | POA: Insufficient documentation

## 2015-06-26 DIAGNOSIS — Z8739 Personal history of other diseases of the musculoskeletal system and connective tissue: Secondary | ICD-10-CM | POA: Diagnosis not present

## 2015-06-26 DIAGNOSIS — R61 Generalized hyperhidrosis: Secondary | ICD-10-CM | POA: Diagnosis not present

## 2015-06-26 DIAGNOSIS — R0781 Pleurodynia: Secondary | ICD-10-CM | POA: Diagnosis not present

## 2015-06-26 DIAGNOSIS — R1110 Vomiting, unspecified: Secondary | ICD-10-CM | POA: Insufficient documentation

## 2015-06-26 DIAGNOSIS — R509 Fever, unspecified: Secondary | ICD-10-CM | POA: Diagnosis present

## 2015-06-26 DIAGNOSIS — R197 Diarrhea, unspecified: Secondary | ICD-10-CM | POA: Insufficient documentation

## 2015-06-26 DIAGNOSIS — J069 Acute upper respiratory infection, unspecified: Secondary | ICD-10-CM | POA: Diagnosis not present

## 2015-06-26 MED ORDER — AZITHROMYCIN 250 MG PO TABS: ORAL_TABLET | ORAL | Status: DC

## 2015-06-26 NOTE — Discharge Instructions (Signed)
Zithromax as prescribed.  Continue over-the-counter medications as needed for symptomatic relief.  Follow-up with your primary Dr. if not improving in the next week.   Upper Respiratory Infection, Adult Most upper respiratory infections (URIs) are a viral infection of the air passages leading to the lungs. A URI affects the nose, throat, and upper air passages. The most common type of URI is nasopharyngitis and is typically referred to as "the common cold." URIs run their course and usually go away on their own. Most of the time, a URI does not require medical attention, but sometimes a bacterial infection in the upper airways can follow a viral infection. This is called a secondary infection. Sinus and middle ear infections are common types of secondary upper respiratory infections. Bacterial pneumonia can also complicate a URI. A URI can worsen asthma and chronic obstructive pulmonary disease (COPD). Sometimes, these complications can require emergency medical care and may be life threatening.  CAUSES Almost all URIs are caused by viruses. A virus is a type of germ and can spread from one person to another.  RISKS FACTORS You may be at risk for a URI if:   You smoke.   You have chronic heart or lung disease.  You have a weakened defense (immune) system.   You are very young or very old.   You have nasal allergies or asthma.  You work in crowded or poorly ventilated areas.  You work in health care facilities or schools. SIGNS AND SYMPTOMS  Symptoms typically develop 2-3 days after you come in contact with a cold virus. Most viral URIs last 7-10 days. However, viral URIs from the influenza virus (flu virus) can last 14-18 days and are typically more severe. Symptoms may include:   Runny or stuffy (congested) nose.   Sneezing.   Cough.   Sore throat.   Headache.   Fatigue.   Fever.   Loss of appetite.   Pain in your forehead, behind your eyes, and over your  cheekbones (sinus pain).  Muscle aches.  DIAGNOSIS  Your health care provider may diagnose a URI by:  Physical exam.  Tests to check that your symptoms are not due to another condition such as:  Strep throat.  Sinusitis.  Pneumonia.  Asthma. TREATMENT  A URI goes away on its own with time. It cannot be cured with medicines, but medicines may be prescribed or recommended to relieve symptoms. Medicines may help:  Reduce your fever.  Reduce your cough.  Relieve nasal congestion. HOME CARE INSTRUCTIONS   Take medicines only as directed by your health care provider.   Gargle warm saltwater or take cough drops to comfort your throat as directed by your health care provider.  Use a warm mist humidifier or inhale steam from a shower to increase air moisture. This may make it easier to breathe.  Drink enough fluid to keep your urine clear or pale yellow.   Eat soups and other clear broths and maintain good nutrition.   Rest as needed.   Return to work when your temperature has returned to normal or as your health care provider advises. You may need to stay home longer to avoid infecting others. You can also use a face mask and careful hand washing to prevent spread of the virus.  Increase the usage of your inhaler if you have asthma.   Do not use any tobacco products, including cigarettes, chewing tobacco, or electronic cigarettes. If you need help quitting, ask your health care provider. PREVENTION  The best way to protect yourself from getting a cold is to practice good hygiene.   Avoid oral or hand contact with people with cold symptoms.   Wash your hands often if contact occurs.  There is no clear evidence that vitamin C, vitamin E, echinacea, or exercise reduces the chance of developing a cold. However, it is always recommended to get plenty of rest, exercise, and practice good nutrition.  SEEK MEDICAL CARE IF:   You are getting worse rather than better.    Your symptoms are not controlled by medicine.   You have chills.  You have worsening shortness of breath.  You have brown or red mucus.  You have yellow or brown nasal discharge.  You have pain in your face, especially when you bend forward.  You have a fever.  You have swollen neck glands.  You have pain while swallowing.  You have white areas in the back of your throat. SEEK IMMEDIATE MEDICAL CARE IF:   You have severe or persistent:  Headache.  Ear pain.  Sinus pain.  Chest pain.  You have chronic lung disease and any of the following:  Wheezing.  Prolonged cough.  Coughing up blood.  A change in your usual mucus.  You have a stiff neck.  You have changes in your:  Vision.  Hearing.  Thinking.  Mood. MAKE SURE YOU:   Understand these instructions.  Will watch your condition.  Will get help right away if you are not doing well or get worse.   This information is not intended to replace advice given to you by your health care provider. Make sure you discuss any questions you have with your health care provider.   Document Released: 01/07/2001 Document Revised: 11/28/2014 Document Reviewed: 10/19/2013 Elsevier Interactive Patient Education [employer]2016 Elsevier Inc.

## 2015-06-26 NOTE — ED Notes (Signed)
Head and chest congestion x 3 weeks-diarrhea x 6 days-vomiting x 2 days-steady gait to triage-NAD

## 2015-06-26 NOTE — ED Provider Notes (Signed)
CSN: [id number]     Arrival date & time 06/26/15  1928 History  By signing my name below, I, Phillis HaggisGabriella Gaje, attest that this documentation has been prepared under the direction and in the presence of Geoffery Lyonsouglas Laine Fonner, MD. Electronically Signed: Phillis HaggisGabriella Gaje, ED Scribe. 06/26/2015. 7:50 PM.   Chief Complaint  Patient presents with  . Fever   Patient is a 39 y.o. male presenting with fever. The history is provided by the patient. No language interpreter was used.  Fever Temp source:  Subjective Severity:  Mild Onset quality:  Sudden Timing:  Intermittent Progression:  Worsening Chronicity:  New Ineffective treatments:  None tried Associated symptoms: chest pain (rib pain), congestion, cough, diarrhea, headaches and vomiting   Associated symptoms: no chills, no dysuria and no ear pain   HPI Comments: Keith [name] is a 39 y.o. male who presents to the Emergency Department complaining of intermittent fever onset 3 weeks ago. He states that the fevers worsen at night with associated diaphoresis. Pt reports associated productive cough with green sputum, bilateral rib pain, head and chest congestion for 3 weeks, diarrhea 6 days ago, and vomiting 2 days ago. He reports, "It feels like a brick is sitting on my head." Pt believed that his symptoms were initially a sinus infection. Pt states that he has been taking Mucinex, Tylenol cold and flu to relief, but the symptoms will return. He reports sick contacts at work. He denies hematochezia, dysuria, numbness, or weakness.   Past Medical History  Diagnosis Date  . Heartburn   . Arthritis   . Back pain with sciatica 10/04/13    left leg   Past Surgical History  Procedure Laterality Date  . Knee surgery    . Foot surgery    . Back surgery      lumbar x 2  . Cholecystectomy    . Hemi-microdiscectomy lumbar laminectomy level 1 Left 10/12/2013    Procedure: REDO HEMI-LAMINECTOMY MICRODISCECTOMY L5-S1 ON LEFT ;  Surgeon: Jacki Conesonald A Gioffre, MD;   Location: [facility];  Service: Orthopedics;  Laterality: Left;   No family history on file. Social History  Substance Use Topics  . Smoking status: Current Every Day Smoker -- 0.50 packs/day for 22 years  . Smokeless tobacco: Never Used  . Alcohol Use: Yes     Comment: occasional    Review of Systems  Constitutional: Positive for fever. Negative for chills.  HENT: Positive for congestion. Negative for ear pain.   Respiratory: Positive for cough.   Cardiovascular: Positive for chest pain (rib pain).  Gastrointestinal: Positive for vomiting and diarrhea.  Genitourinary: Negative for dysuria.  Neurological: Positive for headaches. Negative for weakness and numbness.  All other systems reviewed and are negative.  Allergies  Review of patient's allergies indicates no known allergies.  Home Medications   Prior to Admission medications   Not on File   BP 135/99 mmHg  Pulse 101  Temp(Src) 98.3 F (36.8 C) (Oral)  Resp 20  Ht 5\' 11"  (1.803 m)  Wt 265 lb (120.203 kg)  BMI 36.98 kg/m2  SpO2 98% Physical Exam  Constitutional: He is oriented to person, place, and time. He appears well-developed and well-nourished.  HENT:  Head: Normocephalic and atraumatic.  Mouth/Throat: Oropharynx is clear and moist. No oropharyngeal exudate.  Eyes: Conjunctivae and EOM are normal. Pupils are equal, round, and reactive to light.  Neck: Normal range of motion.  Cardiovascular: Normal rate, regular rhythm, normal heart sounds and intact distal pulses.  No murmur heard. Pulmonary/Chest: Effort normal and breath sounds normal. No respiratory distress. He has no wheezes. He has no rales.  Abdominal: Soft. He exhibits no distension. There is no tenderness.  Musculoskeletal: Normal range of motion.  Lymphadenopathy:    He has no cervical adenopathy.  Neurological: He is alert and oriented to person, place, and time.  Skin: Skin is warm and dry.  Psychiatric: He has a normal mood and affect. Judgment  normal.  Nursing note and vitals reviewed.   ED Course  Procedures (including critical care time) DIAGNOSTIC STUDIES: Oxygen Saturation is 98% on RA, normal by my interpretation.    COORDINATION OF CARE: 7:48 PM-Discussed treatment plan which includes Zithromax with pt at bedside and pt agreed to plan.   Labs Review Labs Reviewed - No data to display  Imaging Review No results found. I have personally reviewed and evaluated these images and lab results as part of my medical decision-making.   EKG Interpretation None      MDM   Final diagnoses:  None    Patient with a three-week history of persistent cough, congestion, and fever. He is not improving despite over-the-counter medications. Due to the prolonged nature of his symptoms, I will prescribe antibiotics and see if this helps. He is to return as needed for any problems. There is no hypoxia, respiratory distress or other acute concerns.  I personally performed the services described in this documentation, which was scribed in my presence. The recorded information has been reviewed and is accurate.       Geoffery Lyons, MD 06/26/15 2303

## 2015-10-23 ENCOUNTER — Emergency Department (HOSPITAL_BASED_OUTPATIENT_CLINIC_OR_DEPARTMENT_OTHER)
Admission: EM | Admit: 2015-10-23 | Discharge: 2015-10-23 | Disposition: A | Discharge status: Home / Self Care | Payer: Self-pay | Attending: Emergency Medicine | Admitting: Emergency Medicine

## 2015-10-23 ENCOUNTER — Encounter (HOSPITAL_BASED_OUTPATIENT_CLINIC_OR_DEPARTMENT_OTHER): Payer: Self-pay | Admitting: *Deleted

## 2015-10-23 DIAGNOSIS — B9789 Other viral agents as the cause of diseases classified elsewhere: Secondary | ICD-10-CM

## 2015-10-23 DIAGNOSIS — J069 Acute upper respiratory infection, unspecified: Secondary | ICD-10-CM | POA: Insufficient documentation

## 2015-10-23 DIAGNOSIS — F17200 Nicotine dependence, unspecified, uncomplicated: Secondary | ICD-10-CM | POA: Insufficient documentation

## 2015-10-23 DIAGNOSIS — Z8739 Personal history of other diseases of the musculoskeletal system and connective tissue: Secondary | ICD-10-CM | POA: Insufficient documentation

## 2015-10-23 DIAGNOSIS — Z79899 Other long term (current) drug therapy: Secondary | ICD-10-CM | POA: Insufficient documentation

## 2015-10-23 DIAGNOSIS — R112 Nausea with vomiting, unspecified: Secondary | ICD-10-CM | POA: Insufficient documentation

## 2015-10-23 MED ORDER — ALBUTEROL SULFATE HFA 108 (90 BASE) MCG/ACT IN AERS: 1.0000 | INHALATION_SPRAY | Freq: Four times a day (QID) | RESPIRATORY_TRACT | Status: DC | PRN

## 2015-10-23 NOTE — ED Provider Notes (Signed)
CSN: [id number]     Arrival date & time 10/23/15  1302 History   First MD Initiated Contact with Patient 10/23/15 1711     Chief Complaint  Patient presents with  . Cough   HPI   40 year old male presents today with complaints of upper respiratory infection. Patient reports symptoms started last night with episode of nausea vomiting, sore throat and nonproductive cough. Patient reports symptoms have persisted, have not worsened. He denies any fever, chest pain, shortness of breath, productive cough, abdominal pain. Patient reports he's been able to tolerate by mouth without difficulty. Patient reports he does not take any daily medicines, has no chronic health conditions.    Past Medical History  Diagnosis Date  . Heartburn   . Arthritis   . Back pain with sciatica 10/04/13    left leg   Past Surgical History  Procedure Laterality Date  . Knee surgery    . Foot surgery    . Back surgery      lumbar x 2  . Cholecystectomy    . Hemi-microdiscectomy lumbar laminectomy level 1 Left 10/12/2013    Procedure: REDO HEMI-LAMINECTOMY MICRODISCECTOMY L5-S1 ON LEFT ;  Surgeon: Jacki Conesonald A Gioffre, MD;  Location: [facility];  Service: Orthopedics;  Laterality: Left;   No family history on file. Social History  Substance Use Topics  . Smoking status: Current Every Day Smoker -- 0.50 packs/day for 22 years  . Smokeless tobacco: Never Used  . Alcohol Use: Yes     Comment: occasional    Review of Systems  All other systems reviewed and are negative.     Allergies  Review of patient's allergies indicates no known allergies.  Home Medications   Prior to Admission medications   Medication Sig Start Date End Date Taking? Authorizing Provider  albuterol (PROVENTIL HFA;VENTOLIN HFA) 108 (90 Base) MCG/ACT inhaler Inhale 1-2 puffs into the lungs every 6 (six) hours as needed for wheezing or shortness of breath. 10/23/15   Eyvonne MechanicJeffrey Qadir Folks, PA-C  azithromycin (ZITHROMAX Z-PAK) 250 MG tablet 2 po day  one, then 1 daily x 4 days 06/26/15   Geoffery Lyonsouglas Delo, MD   BP 131/88 mmHg  Pulse 73  Temp(Src) 98 F (36.7 C) (Oral)  Resp 20  Ht 5\' 11"  (1.803 m)  Wt 120.203 kg  BMI 36.98 kg/m2  SpO2 98% Physical Exam  Constitutional: He is oriented to person, place, and time. He appears well-developed and well-nourished.  HENT:  Head: Normocephalic and atraumatic.  Eyes: Conjunctivae are normal. Pupils are equal, round, and reactive to light. Right eye exhibits no discharge. Left eye exhibits no discharge. No scleral icterus.  Neck: Normal range of motion. No JVD present. No tracheal deviation present.  Cardiovascular: Normal rate, regular rhythm, normal heart sounds and intact distal pulses.  Exam reveals no gallop and no friction rub.   No murmur heard. Pulmonary/Chest: Effort normal. No stridor. No respiratory distress. He has wheezes. He has no rales. He exhibits no tenderness.  Abdominal: Soft. He exhibits no distension and no mass. There is no tenderness. There is no rebound and no guarding.  Neurological: He is alert and oriented to person, place, and time. Coordination normal.  Skin: Skin is warm and dry. No rash noted. No erythema. No pallor.  Psychiatric: He has a normal mood and affect. His behavior is normal. Judgment and thought content normal.  Nursing note and vitals reviewed.     ED Course  Procedures (including critical care time) Labs Review Labs Reviewed -  No data to display  Imaging Review No results found. I have personally reviewed and evaluated these images and lab results as part of my medical decision-making.   EKG Interpretation None      MDM   Final diagnoses:  Viral URI with cough    Labs:  Imaging:  Consults:  Therapeutics:  Discharge Meds: Albuterol  Assessment/Plan:40 year old male presents today with likely viral URI. Patient is afebrile nontoxic, he is very small amount wheeze, no signs of infectious etiology. Patient will be discharged home  with albuterol, symptomatic care instructions, should return precautions. He verbalized understanding and agreement to today's plan        Latroy Gaymon, PA-C 10/23/15 1749  Pricilla Loveless, MD 10/25/15 1455

## 2015-10-23 NOTE — ED Notes (Signed)
Vomiting and diarrhea last night. Cough, body aches, scratchy throat today.

## 2015-10-23 NOTE — Discharge Instructions (Signed)
Upper Respiratory Infection, Adult Most upper respiratory infections (URIs) are a viral infection of the air passages leading to the lungs. A URI affects the nose, throat, and upper air passages. The most common type of URI is nasopharyngitis and is typically referred to as "the common cold." URIs run their course and usually go away on their own. Most of the time, a URI does not require medical attention, but sometimes a bacterial infection in the upper airways can follow a viral infection. This is called a secondary infection. Sinus and middle ear infections are common types of secondary upper respiratory infections. Bacterial pneumonia can also complicate a URI. A URI can worsen asthma and chronic obstructive pulmonary disease (COPD). Sometimes, these complications can require emergency medical care and may be life threatening.  CAUSES Almost all URIs are caused by viruses. A virus is a type of germ and can spread from one person to another.  RISKS FACTORS You may be at risk for a URI if:   You smoke.   You have chronic heart or lung disease.  You have a weakened defense (immune) system.   You are very young or very old.   You have nasal allergies or asthma.  You work in crowded or poorly ventilated areas.  You work in health care facilities or schools. SIGNS AND SYMPTOMS  Symptoms typically develop 2-3 days after you come in contact with a cold virus. Most viral URIs last 7-10 days. However, viral URIs from the influenza virus (flu virus) can last 14-18 days and are typically more severe. Symptoms may include:   Runny or stuffy (congested) nose.   Sneezing.   Cough.   Sore throat.   Headache.   Fatigue.   Fever.   Loss of appetite.   Pain in your forehead, behind your eyes, and over your cheekbones (sinus pain).  Muscle aches.  DIAGNOSIS  Your health care provider may diagnose a URI by:  Physical exam.  Tests to check that your symptoms are not due to  another condition such as:  Strep throat.  Sinusitis.  Pneumonia.  Asthma. TREATMENT  A URI goes away on its own with time. It cannot be cured with medicines, but medicines may be prescribed or recommended to relieve symptoms. Medicines may help:  Reduce your fever.  Reduce your cough.  Relieve nasal congestion. HOME CARE INSTRUCTIONS   Take medicines only as directed by your health care provider.   Gargle warm saltwater or take cough drops to comfort your throat as directed by your health care provider.  Use a warm mist humidifier or inhale steam from a shower to increase air moisture. This may make it easier to breathe.  Drink enough fluid to keep your urine clear or pale yellow.   Eat soups and other clear broths and maintain good nutrition.   Rest as needed.   Return to work when your temperature has returned to normal or as your health care provider advises. You may need to stay home longer to avoid infecting others. You can also use a face mask and careful hand washing to prevent spread of the virus.  Increase the usage of your inhaler if you have asthma.   Do not use any tobacco products, including cigarettes, chewing tobacco, or electronic cigarettes. If you need help quitting, ask your health care provider. PREVENTION  The best way to protect yourself from getting a cold is to practice good hygiene.   Avoid oral or hand contact with people with cold   symptoms.   Wash your hands often if contact occurs.  There is no clear evidence that vitamin C, vitamin E, echinacea, or exercise reduces the chance of developing a cold. However, it is always recommended to get plenty of rest, exercise, and practice good nutrition.  SEEK MEDICAL CARE IF:   You are getting worse rather than better.   Your symptoms are not controlled by medicine.   You have chills.  You have worsening shortness of breath.  You have brown or red mucus.  You have yellow or brown nasal  discharge.  You have pain in your face, especially when you bend forward.  You have a fever.  You have swollen neck glands.  You have pain while swallowing.  You have white areas in the back of your throat. SEEK IMMEDIATE MEDICAL CARE IF:   You have severe or persistent:  Headache.  Ear pain.  Sinus pain.  Chest pain.  You have chronic lung disease and any of the following:  Wheezing.  Prolonged cough.  Coughing up blood.  A change in your usual mucus.  You have a stiff neck.  You have changes in your:  Vision.  Hearing.  Thinking.  Mood. MAKE SURE YOU:   Understand these instructions.  Will watch your condition.  Will get help right away if you are not doing well or get worse.   This information is not intended to replace advice given to you by your health care provider. Make sure you discuss any questions you have with your health care provider.   Document Released: 01/07/2001 Document Revised: 11/28/2014 Document Reviewed: 10/19/2013 Elsevier Interactive Patient Education 2016 [employer] Inc.  

## 2015-10-23 NOTE — ED Notes (Signed)
Pt. Reports he has not eaten today due to being here in the ED.  Pt. Reports he has had no nausea.  Pt. Reports at times he has had dizziness.

## 2016-01-18 ENCOUNTER — Emergency Department (HOSPITAL_BASED_OUTPATIENT_CLINIC_OR_DEPARTMENT_OTHER)
Admission: EM | Admit: 2016-01-18 | Discharge: 2016-01-18 | Disposition: A | Discharge status: Home / Self Care | Payer: Self-pay | Attending: Emergency Medicine | Admitting: Emergency Medicine

## 2016-01-18 ENCOUNTER — Encounter (HOSPITAL_BASED_OUTPATIENT_CLINIC_OR_DEPARTMENT_OTHER): Payer: Self-pay | Admitting: *Deleted

## 2016-01-18 DIAGNOSIS — M1990 Unspecified osteoarthritis, unspecified site: Secondary | ICD-10-CM | POA: Insufficient documentation

## 2016-01-18 DIAGNOSIS — F17200 Nicotine dependence, unspecified, uncomplicated: Secondary | ICD-10-CM | POA: Insufficient documentation

## 2016-01-18 DIAGNOSIS — L03115 Cellulitis of right lower limb: Secondary | ICD-10-CM | POA: Insufficient documentation

## 2016-01-18 MED ORDER — DOXYCYCLINE HYCLATE 100 MG PO CAPS: 100.0000 mg | ORAL_CAPSULE | Freq: Two times a day (BID) | ORAL | Status: DC

## 2016-01-18 MED ORDER — LIDOCAINE HCL (PF) 1 % IJ SOLN
5.0000 mL | Freq: Once | INTRAMUSCULAR | Status: AC
  Administered 2016-01-18: 5 mL

## 2016-01-18 MED ORDER — LIDOCAINE HCL (PF) 1 % IJ SOLN
INTRAMUSCULAR | Status: AC
  Administered 2016-01-18: 5 mL
  Filled 2016-01-18: qty 5

## 2016-01-18 MED ORDER — CEPHALEXIN 500 MG PO CAPS: 500.0000 mg | ORAL_CAPSULE | Freq: Four times a day (QID) | ORAL | Status: DC

## 2016-01-18 MED FILL — CEPHALEXIN 500 MG CAPSULE: 500 | 10 days supply | Qty: 40 | Fill #0

## 2016-01-18 MED FILL — DOXYCYCLINE HYC 100 MG CAP: 100 | 10 days supply | Qty: 20 | Fill #0

## 2016-01-18 NOTE — Discharge Instructions (Signed)

## 2016-01-18 NOTE — ED Notes (Signed)
Supplies placed at bedside for pa.

## 2016-01-18 NOTE — ED Notes (Signed)
Pt c/o abscess to calf x 2 days ? Insect bite

## 2016-01-18 NOTE — ED Provider Notes (Signed)
CSN: [id number]     Arrival date & time 01/18/16  1517 History   First MD Initiated Contact with Patient 01/18/16 1526     Chief Complaint  Patient presents with  . Abscess   HPI   40 year old male presents today with infection to his lower extremity. He reports that he noticed redness, and scabbing to the right lower extremity just distal to the knee. He reports minor amount of surrounding redness. He does not recall any specific insect bites, trauma to the skin, or history of the same. Patient does note that he has scabs on his lower extremities, uncertain if these were also insect bites or trauma. Patient denies any fever, chills, nausea, vomiting, or any other complaints. No medications prior to arrival. Otherwise healthy; no history of diabetes.   Past Medical History  Diagnosis Date  . Heartburn   . Arthritis   . Back pain with sciatica 10/04/13    left leg   Past Surgical History  Procedure Laterality Date  . Knee surgery    . Foot surgery    . Back surgery      lumbar x 2  . Cholecystectomy    . Hemi-microdiscectomy lumbar laminectomy level 1 Left 10/12/2013    Procedure: REDO HEMI-LAMINECTOMY MICRODISCECTOMY L5-S1 ON LEFT ;  Surgeon: Jacki Conesonald A Gioffre, MD;  Location: [facility];  Service: Orthopedics;  Laterality: Left;   History reviewed. No pertinent family history. Social History  Substance Use Topics  . Smoking status: Current Every Day Smoker -- 1.00 packs/day for 22 years  . Smokeless tobacco: Never Used  . Alcohol Use: Yes     Comment: occasional    Review of Systems  All other systems reviewed and are negative.     Allergies  Review of patient's allergies indicates no known allergies.  Home Medications   Prior to Admission medications   Medication Sig Start Date End Date Taking? Authorizing Provider  cephALEXin (KEFLEX) 500 MG capsule Take 1 capsule (500 mg total) by mouth 4 (four) times daily. 01/18/16   Eyvonne MechanicJeffrey Clois Montavon, PA-C  doxycycline (VIBRAMYCIN) 100  MG capsule Take 1 capsule (100 mg total) by mouth 2 (two) times daily. 01/18/16   Eyvonne MechanicJeffrey Kel Senn, PA-C   BP 127/83 mmHg  Pulse 81  Temp(Src) 98.3 F (36.8 C)  Resp 18  Ht 5\' 11"  (1.803 m)  Wt 120.203 kg  BMI 36.98 kg/m2  SpO2 99% Physical Exam  Constitutional: He is oriented to person, place, and time. He appears well-developed and well-nourished.  HENT:  Head: Normocephalic and atraumatic.  Eyes: Conjunctivae are normal. Pupils are equal, round, and reactive to light. Right eye exhibits no discharge. Left eye exhibits no discharge. No scleral icterus.  Neck: Normal range of motion. No JVD present. No tracheal deviation present.  Pulmonary/Chest: Effort normal. No stridor.  Musculoskeletal:       Legs: Small area of erythema and scabbing to the right lateral aspect of the lower extremity. No fluctuance noted. No significant surrounding tenderness  Neurological: He is alert and oriented to person, place, and time. Coordination normal.  Psychiatric: He has a normal mood and affect. His behavior is normal. Judgment and thought content normal.  Nursing note and vitals reviewed.   ED Course  Procedures (including critical care time)  EMERGENCY DEPARTMENT US SOFT TISSUE INTERPRETATION "Study: Limited Ultrasound of the noted body part in comments below"  INDICATIONS: Soft tissue infection Multiple views of the body part are obtained with a multi-frequency linear probe  PERFORMED  BY:  Myself  IMAGES ARCHIVED?: Yes  SIDE:Right   BODY PART:Lower extremity  FINDINGS: Abcess present  LIMITATIONS:  Body Habitus  INTERPRETATION:  Abcess present  COMMENT:  Abscesses noted  INCISION AND DRAINAGE Performed by: Thermon LeylandHedges,Dquan Todd Consent: Verbal consent obtained. Risks and benefits: risks, benefits and alternatives were discussed Type: abscess  Body area: Right lower extremity  Anesthesia: local infiltration  Incision was made with a scalpel.  Local anesthetic: lidocaine  1 % 0 epinephrine  Anesthetic total: 3 ml  Complexity: complex Blunt dissection to break up loculations  Drainage:   Drainage amount: 0  Packing material:   Patient tolerance: Patient tolerated the procedure well with no immediate complications.    Labs Review Labs Reviewed - No data to display  Imaging Review No results found. I have personally reviewed and evaluated these images and lab results as part of my medical decision-making.   EKG Interpretation None      MDM   Final diagnoses:  Cellulitis of right lower extremity   Labs:   Imaging:  Consults:  Therapeutics:  Discharge Meds: keflex, doxy  Assessment/Plan:89-year-old male presents today with infection to his right lower extremity. This appears to be localized, I&D showed no purulent discharge, patient will be started on appropriate antibiotics, he'll be instructed to return to the ED in 2 days for reevaluation, return immediately if any new or worsening signs or symptoms present. He is afebrile nontoxic for signs of systemic illness. Patient verbalized understanding and agreement today's plan.        Eyvonne MechanicJeffrey Chloe Flis, PA-C 01/18/16 1701  Rolan BuccoMelanie Belfi, MD 01/19/16 [phone]

## 2016-03-06 ENCOUNTER — Emergency Department (HOSPITAL_COMMUNITY): Payer: Self-pay

## 2016-03-06 ENCOUNTER — Emergency Department (HOSPITAL_COMMUNITY)
Admission: EM | Admit: 2016-03-06 | Discharge: 2016-03-06 | Disposition: A | Discharge status: Home / Self Care | Payer: Self-pay | Attending: Emergency Medicine | Admitting: Emergency Medicine

## 2016-03-06 ENCOUNTER — Encounter (HOSPITAL_COMMUNITY): Payer: Self-pay

## 2016-03-06 DIAGNOSIS — R1030 Lower abdominal pain, unspecified: Secondary | ICD-10-CM

## 2016-03-06 DIAGNOSIS — R197 Diarrhea, unspecified: Secondary | ICD-10-CM

## 2016-03-06 DIAGNOSIS — R112 Nausea with vomiting, unspecified: Secondary | ICD-10-CM

## 2016-03-06 DIAGNOSIS — L03317 Cellulitis of buttock: Secondary | ICD-10-CM

## 2016-03-06 DIAGNOSIS — F17200 Nicotine dependence, unspecified, uncomplicated: Secondary | ICD-10-CM | POA: Insufficient documentation

## 2016-03-06 LAB — URINALYSIS, ROUTINE W REFLEX MICROSCOPIC
GLUCOSE, UA: NEGATIVE mg/dL
KETONES UR: 15 mg/dL — AB
Nitrite: NEGATIVE
Protein, ur: 100 mg/dL — AB
Specific Gravity, Urine: 1.025 (ref 1.005–1.030)
pH: 5.5 (ref 5.0–8.0)

## 2016-03-06 LAB — COMPREHENSIVE METABOLIC PANEL
ALBUMIN: 3.8 g/dL (ref 3.5–5.0)
ALT: 64 U/L — ABNORMAL HIGH (ref 17–63)
AST: 40 U/L (ref 15–41)
Alkaline Phosphatase: 66 U/L (ref 38–126)
Anion gap: 8 (ref 5–15)
BUN: 9 mg/dL (ref 6–20)
CHLORIDE: 101 mmol/L (ref 101–111)
CO2: 27 mmol/L (ref 22–32)
Calcium: 9.5 mg/dL (ref 8.9–10.3)
Creatinine, Ser: 1.08 mg/dL (ref 0.61–1.24)
GFR calc Af Amer: >60 mL/min (ref >60)
GFR calc non Af Amer: >60 mL/min (ref >60)
GLUCOSE: 132 mg/dL — AB (ref 65–99)
POTASSIUM: 4.2 mmol/L (ref 3.5–5.1)
Sodium: 136 mmol/L (ref 135–145)
Total Bilirubin: 1.4 mg/dL — ABNORMAL HIGH (ref 0.3–1.2)
Total Protein: 7.7 g/dL (ref 6.5–8.1)

## 2016-03-06 LAB — CBC
HEMATOCRIT: 43.6 % (ref 39.0–52.0)
Hemoglobin: 15 g/dL (ref 13.0–17.0)
MCH: 33.9 pg (ref 26.0–34.0)
MCHC: 34.4 g/dL (ref 30.0–36.0)
MCV: 98.6 fL (ref 78.0–100.0)
Platelets: 281 10*3/uL (ref 150–400)
RBC: 4.42 MIL/uL (ref 4.22–5.81)
RDW: 12.2 % (ref 11.5–15.5)
WBC: 13 10*3/uL — AB (ref 4.0–10.5)

## 2016-03-06 LAB — URINE MICROSCOPIC-ADD ON: Squamous Epithelial / LPF: NONE SEEN

## 2016-03-06 LAB — LIPASE, BLOOD: LIPASE: 27 U/L (ref 11–51)

## 2016-03-06 MED ORDER — HYDROCODONE-ACETAMINOPHEN 5-325 MG PO TABS: 1.0000 | ORAL_TABLET | ORAL | 0 refills | Status: DC | PRN

## 2016-03-06 MED ORDER — ONDANSETRON HCL 4 MG/2ML IJ SOLN
4.0000 mg | Freq: Once | INTRAMUSCULAR | Status: AC
  Administered 2016-03-06: 4 mg via INTRAVENOUS
  Filled 2016-03-06: qty 2

## 2016-03-06 MED ORDER — IOPAMIDOL (ISOVUE-300) INJECTION 61%
INTRAVENOUS | Status: AC
  Administered 2016-03-06: 100 mL
  Filled 2016-03-06: qty 100

## 2016-03-06 MED ORDER — ONDANSETRON HCL 4 MG PO TABS: 4.0000 mg | ORAL_TABLET | Freq: Four times a day (QID) | ORAL | 0 refills | Status: DC

## 2016-03-06 MED ORDER — DICYCLOMINE HCL 20 MG PO TABS: 20.0000 mg | ORAL_TABLET | Freq: Two times a day (BID) | ORAL | 0 refills | Status: DC

## 2016-03-06 MED ORDER — MORPHINE SULFATE (PF) 4 MG/ML IV SOLN
4.0000 mg | INTRAVENOUS | Status: DC | PRN
  Administered 2016-03-06: 4 mg via INTRAVENOUS
  Filled 2016-03-06: qty 1

## 2016-03-06 MED ORDER — SULFAMETHOXAZOLE-TRIMETHOPRIM 800-160 MG PO TABS: 1.0000 | ORAL_TABLET | Freq: Two times a day (BID) | ORAL | 0 refills | Status: AC

## 2016-03-06 MED ORDER — SODIUM CHLORIDE 0.9 % IV BOLUS (SEPSIS)
1000.0000 mL | Freq: Once | INTRAVENOUS | Status: AC
  Administered 2016-03-06: 1000 mL via INTRAVENOUS

## 2016-03-06 NOTE — ED Notes (Signed)
Pt given ginger ale to drink. 

## 2016-03-06 NOTE — ED Triage Notes (Signed)
Patient complains of abdominal pain since Sunday, yesterday developed vomiting and diarrhea. Also concerned that he has large abscess to buttocks near rectum for several days. Vomiting on arrival

## 2016-03-06 NOTE — ED Provider Notes (Signed)
Emergency Department Provider Note   I have reviewed the triage vital signs and the nursing notes.   HISTORY  Chief Complaint Abdominal Pain and Emesis   HPI Keith [name] is a 40 y.o. male with PMH of GERD and prior abscess presents to the emergency department for evaluation of sudden worsening pain in the right with associated vomiting and diarrhea. Patient states that he was seen at an outside emergency department for this issue proximal 0.1 month ago. He was treated with antibiotics and it seemed to get better. Yesterday he noticed a sudden swelling in that area along with pain. He developed associated vomiting and diarrhea without fever or shaking chills. No associated chest pain or difficulty breathing. He denies any blood in his bowel movements. He has had one abscess on his leg that required incision and drainage but none in the perirectal area.  Past Medical History:  Diagnosis Date  . Arthritis   . Back pain with sciatica 10/04/13   left leg  . Heartburn     Patient Active Problem List   Diagnosis Date Noted  . Spinal stenosis, lumbar region, with neurogenic claudication 10/12/2013  . Herniated lumbar intervertebral disc 10/12/2013  . SINUSITIS, CHRONIC 11/30/2008    Past Surgical History:  Procedure Laterality Date  . BACK SURGERY     lumbar x 2  . CHOLECYSTECTOMY    . FOOT SURGERY    . HEMI-MICRODISCECTOMY LUMBAR LAMINECTOMY LEVEL 1 Left 10/12/2013   Procedure: REDO HEMI-LAMINECTOMY MICRODISCECTOMY L5-S1 ON LEFT ;  Surgeon: Jacki Cones, MD;  Location: [facility];  Service: Orthopedics;  Laterality: Left;  . KNEE SURGERY      Current Outpatient Rx  . Order #: [id number] Class: Print  . Order #: [id number] Class: Print    Allergies Review of patient's allergies indicates no known allergies.  No family history on file.  Social History Social History  Substance Use Topics  . Smoking status: Current Every Day Smoker    Packs/day: 1.00    Years: 22.00    . Smokeless tobacco: Never Used  . Alcohol use Yes     Comment: occasional    Review of Systems  Constitutional: No fever/chills Eyes: No visual changes. ENT: No sore throat. Cardiovascular: Denies chest pain. Respiratory: Denies shortness of breath. Gastrointestinal: Positive lower abdominal pain.  Positive nausea and vomiting. Positive diarrhea.  No constipation. Positive buttock pain.  Genitourinary: Negative for dysuria. Musculoskeletal: Negative for back pain. Skin: Negative for rash. Neurological: Negative for headaches, focal weakness or numbness.  10-point ROS otherwise negative.  ____________________________________________   PHYSICAL EXAM:  VITAL SIGNS: ED Triage Vitals  Enc Vitals Group     BP 03/06/16 0828 127/80     Pulse Rate 03/06/16 0828 109     Resp 03/06/16 0828 18     Temp 03/06/16 0828 98.6 F (37 C)     Temp Source 03/06/16 0828 Oral     SpO2 03/06/16 0828 99 %     Weight 03/06/16 0828 265 lb (120.2 kg)     Height 03/06/16 0828 5\' 11"  (1.803 m)     Pain Score 03/06/16 0831 10   Constitutional: Alert and oriented. Well appearing and in no acute distress. Eyes: Conjunctivae are normal. PERRL. EOMI. Head: Atraumatic. Nose: No congestion/rhinnorhea. Mouth/Throat: Mucous membranes are moist.  Oropharynx non-erythematous. Neck: No stridor.  Cardiovascular: Tachycardia. Good peripheral circulation. Grossly normal heart sounds.   Respiratory: Normal respiratory effort.  No retractions. Lungs CTAB. Gastrointestinal: Soft with mild lower  abdominal tenderness. No rebound. No distention. Left gluteal mild erythema and more medial area of induration. No fluctuance or drainage. Non-tender rectal exam.  Musculoskeletal: No lower extremity tenderness nor edema. No gross deformities of extremities. Neurologic:  Normal speech and language. No gross focal neurologic deficits are appreciated.  Skin:  Skin is warm, dry and intact. No rash noted. Psychiatric: Mood  and affect are normal. Speech and behavior are normal.  ____________________________________________   LABS (all labs ordered are listed, but only abnormal results are displayed)  Labs Reviewed  COMPREHENSIVE METABOLIC PANEL - Abnormal; Notable for the following:       Result Value   Glucose, Bld 132 (*)    ALT 64 (*)    Total Bilirubin 1.4 (*)    All other components within normal limits  CBC - Abnormal; Notable for the following:    WBC 13.0 (*)    All other components within normal limits  LIPASE, BLOOD  URINALYSIS, ROUTINE W REFLEX MICROSCOPIC (NOT AT [facility])   ____________________________________________  EKG  Reviewed in MUSE. No STEMI.  ____________________________________________  RADIOLOGY  Ct Abdomen Pelvis W Contrast  Result Date: 03/06/2016 CLINICAL DATA:  Perirectal abscess, perirectal pain starting Sunday EXAM: CT ABDOMEN AND PELVIS WITH CONTRAST TECHNIQUE: Multidetector CT imaging of the abdomen and pelvis was performed using the standard protocol following bolus administration of intravenous contrast. CONTRAST:  ISOVUE-300 IOPAMIDOL (ISOVUE-300) INJECTION 61% COMPARISON:  11/22/2012 FINDINGS: Lower chest:  The lung bases are unremarkable. Hepatobiliary: There is fatty infiltration of the liver. No focal hepatic mass. Status postcholecystectomy. Pancreas: Enhanced pancreas is unremarkable. Spleen: Enhanced spleen is unremarkable. Adrenals/Urinary Tract: No adrenal gland mass. Enhanced kidneys shows a lobulated contour. No hydronephrosis or hydroureter. Delayed renal images shows bilateral renal symmetrical excretion. Bilateral visualized proximal ureter is unremarkable. Stomach/Bowel: There is no small bowel obstruction. Mild fluid distended small bowel loop with some air-fluid level most likely ileus or enteritis. No small bowel obstruction. Normal appendix. No pericecal inflammation. The terminal ileum is unremarkable. Some liquid stool and gas noted within right  colon. Some gas noted in transverse colon without significant colonic distension. No distal colonic obstruction. No colitis or diverticulitis. Vascular/Lymphatic: No aortic aneurysm or dissection. No retroperitoneal or mesenteric adenopathy. Reproductive: Prostate gland and seminal vesicles are unremarkable. Other: There is no evidence of ascites or free abdominal air. There is some skin thickening in right lower gluteal region posterior medially. Stranding of subcutaneous fat is noted right posterior perineum and lateral perianal region please see axial images 116 to 109. Findings consistent with extensive cellulitis. No evidence of confluent abscess. No perirectal or perianal abscess. Musculoskeletal: No destructive bony lesions are noted. No evidence of osteomyelitis. Sagittal images of the spine are unremarkable. Right inguinal lymph node measures 1.3 cm probable reactive. IMPRESSION: 1. There is some skin is thickening and stranding of the right posterior perineal fat. Some subcutaneous stranding in right lower gluteal region medially. Please see sagittal image 54. Findings are consistent with extensive cellulitis. No confluent abscess. No perirectal or perianal abscess. No evidence of subcutaneous air to suggest gas-forming infection. 2. There is a right inguinal lymph node measures 1.3 cm short-axis probable reactive. 3. No hydronephrosis or hydroureter. 4. Normal appendix.  No pericecal inflammation. 5. Fatty infiltration of the liver.  Status postcholecystectomy. 6. Mild fluid distended distal small bowel loops with some air-fluid level suspicious for ileus or enteritis. Less likely small bowel obstruction. Electronically Signed   By: Natasha Mead M.D.   On: 03/06/2016  10:46    ____________________________________________   PROCEDURES  Procedure(s) performed:   Procedures  None ____________________________________________   INITIAL IMPRESSION / ASSESSMENT AND PLAN / ED COURSE  Pertinent labs  & imaging results that were available during my care of the patient were reviewed by me and considered in my medical decision making (see chart for details).  Patient resents to the emergency department for evaluation of gluteal abscess associated vomiting and diarrhea. The area has overlying skin changes consistent with candidal infection. There is a small area of induration mild erythema over the right buttock. Some tenderness on rectal exam. Unclear if the perirectal abscesses extending deep into the pelvis. Plan for labs and CT scan of the abdomen and pelvis to assess extent of abscess.   12:31 PM Patient is feeling much improved and is toleration PO. Discussed CT imaging results and return precautions in detail. No change in patient's abdominal exam. Plan for antibiotics at home with cellulitis. No fluid collection for I&D. Will also discharge with nausea and pain medication for symptom management at home. He will follow with his PCP in the coming week.  ____________________________________________  FINAL CLINICAL IMPRESSION(S) / ED DIAGNOSES  Final diagnoses:  Nausea vomiting and diarrhea  Lower abdominal pain  Cellulitis of buttock     MEDICATIONS GIVEN DURING THIS VISIT:  Medications  ondansetron (ZOFRAN) injection 4 mg (4 mg Intravenous Given 03/06/16 0938)  sodium chloride 0.9 % bolus 1,000 mL (0 mLs Intravenous Stopped 03/06/16 1137)  iopamidol (ISOVUE-300) 61 % injection (100 mLs  Contrast Given 03/06/16 0959)     NEW OUTPATIENT MEDICATIONS STARTED DURING THIS VISIT:  None   Note:  This document was prepared using Dragon voice recognition software and may include unintentional dictation errors.  Alona BeneJoshua Long, MD Emergency Medicine   Maia PlanJoshua G Long, MD 03/06/16 [phone]

## 2016-03-06 NOTE — Discharge Instructions (Signed)

## 2016-07-21 ENCOUNTER — Emergency Department (HOSPITAL_BASED_OUTPATIENT_CLINIC_OR_DEPARTMENT_OTHER)
Admission: EM | Admit: 2016-07-21 | Discharge: 2016-07-21 | Disposition: A | Discharge status: Home / Self Care | Payer: Self-pay | Attending: Emergency Medicine | Admitting: Emergency Medicine

## 2016-07-21 ENCOUNTER — Encounter (HOSPITAL_BASED_OUTPATIENT_CLINIC_OR_DEPARTMENT_OTHER): Payer: Self-pay

## 2016-07-21 ENCOUNTER — Emergency Department (HOSPITAL_BASED_OUTPATIENT_CLINIC_OR_DEPARTMENT_OTHER): Payer: Self-pay

## 2016-07-21 DIAGNOSIS — Z79899 Other long term (current) drug therapy: Secondary | ICD-10-CM | POA: Insufficient documentation

## 2016-07-21 DIAGNOSIS — F17200 Nicotine dependence, unspecified, uncomplicated: Secondary | ICD-10-CM | POA: Insufficient documentation

## 2016-07-21 DIAGNOSIS — L03311 Cellulitis of abdominal wall: Secondary | ICD-10-CM | POA: Insufficient documentation

## 2016-07-21 LAB — BASIC METABOLIC PANEL
ANION GAP: 9 (ref 5–15)
BUN: 12 mg/dL (ref 6–20)
CO2: 26 mmol/L (ref 22–32)
Calcium: 9.2 mg/dL (ref 8.9–10.3)
Chloride: 101 mmol/L (ref 101–111)
Creatinine, Ser: 1.02 mg/dL (ref 0.61–1.24)
Glucose, Bld: 109 mg/dL — ABNORMAL HIGH (ref 65–99)
POTASSIUM: 3.8 mmol/L (ref 3.5–5.1)
SODIUM: 136 mmol/L (ref 135–145)

## 2016-07-21 LAB — CBC WITH DIFFERENTIAL/PLATELET
BASOS ABS: 0 10*3/uL (ref 0.0–0.1)
Basophils Relative: 0 %
EOS PCT: 2 %
Eosinophils Absolute: 0.3 10*3/uL (ref 0.0–0.7)
HEMATOCRIT: 41.1 % (ref 39.0–52.0)
Hemoglobin: 14.3 g/dL (ref 13.0–17.0)
LYMPHS PCT: 21 %
Lymphs Abs: 2.9 10*3/uL (ref 0.7–4.0)
MCH: 32.9 pg (ref 26.0–34.0)
MCHC: 34.8 g/dL (ref 30.0–36.0)
MCV: 94.7 fL (ref 78.0–100.0)
Monocytes Absolute: 1.1 10*3/uL — ABNORMAL HIGH (ref 0.1–1.0)
Monocytes Relative: 8 %
NEUTROS ABS: 9.5 10*3/uL — AB (ref 1.7–7.7)
Neutrophils Relative %: 69 %
PLATELETS: 256 10*3/uL (ref 150–400)
RBC: 4.34 MIL/uL (ref 4.22–5.81)
RDW: 11.9 % (ref 11.5–15.5)
WBC: 13.9 10*3/uL — AB (ref 4.0–10.5)

## 2016-07-21 LAB — URINALYSIS, ROUTINE W REFLEX MICROSCOPIC
Bilirubin Urine: NEGATIVE
GLUCOSE, UA: NEGATIVE mg/dL
Ketones, ur: NEGATIVE mg/dL
Leukocytes, UA: NEGATIVE
Nitrite: NEGATIVE
Protein, ur: NEGATIVE mg/dL
SPECIFIC GRAVITY, URINE: 1.02 (ref 1.005–1.030)
pH: 6 (ref 5.0–8.0)

## 2016-07-21 LAB — URINALYSIS, MICROSCOPIC (REFLEX): WBC, UA: NONE SEEN WBC/hpf (ref 0–5)

## 2016-07-21 MED ORDER — IOPAMIDOL (ISOVUE-300) INJECTION 61%
100.0000 mL | Freq: Once | INTRAVENOUS | Status: AC | PRN
  Administered 2016-07-21: 100 mL via INTRAVENOUS

## 2016-07-21 MED ORDER — HYDROCODONE-ACETAMINOPHEN 5-325 MG PO TABS: 1.0000 | ORAL_TABLET | Freq: Four times a day (QID) | ORAL | 0 refills | Status: DC | PRN

## 2016-07-21 MED ORDER — CEPHALEXIN 500 MG PO CAPS: 500.0000 mg | ORAL_CAPSULE | Freq: Four times a day (QID) | ORAL | 0 refills | Status: DC

## 2016-07-21 MED ORDER — SULFAMETHOXAZOLE-TRIMETHOPRIM 800-160 MG PO TABS: 1.0000 | ORAL_TABLET | Freq: Two times a day (BID) | ORAL | 0 refills | Status: AC

## 2016-07-21 MED ORDER — DEXTROSE 5 % IV SOLN
1.0000 g | Freq: Once | INTRAVENOUS | Status: AC
  Administered 2016-07-21: 1 g via INTRAVENOUS
  Filled 2016-07-21: qty 10

## 2016-07-21 MED ORDER — SODIUM CHLORIDE 0.9 % IV BOLUS (SEPSIS)
1000.0000 mL | Freq: Once | INTRAVENOUS | Status: AC
  Administered 2016-07-21: 1000 mL via INTRAVENOUS

## 2016-07-21 NOTE — ED Triage Notes (Signed)
Right groin pain since Saturday-NAD-slow gait

## 2016-07-21 NOTE — ED Notes (Signed)
Patient back from CT.

## 2016-07-21 NOTE — Discharge Instructions (Signed)
Keflex and Bactrim as prescribed.  Hydrocodone as prescribed as needed for pain.  Return to the Emergency Department if you develop worsening pain, high fever, vomiting, or other new and concerning symptoms.

## 2016-07-21 NOTE — ED Provider Notes (Signed)
[facility] Provider Note   CSN: [id number] Arrival date & time: 07/21/16  1443   By signing my name below, I, Cynda AcresHailei Fulton, attest that this documentation has been prepared under the direction and in the presence of Geoffery Lyonsouglas Attilio Zeitler, MD. Electronically Signed: Cynda AcresHailei Fulton, Scribe. 07/21/16. 3:51 PM.  History   Chief Complaint Chief Complaint  Patient presents with  . Groin Pain   HPI Comments: Keith [name] is a 40 y.o. male who presents to the Emergency Department complaining of sudden-onset groin pain that began 2 days ago. Patient describes the pain radiates to his flank. He states he was pulling a toilet about 30 feet when he "pulled" something in his groin. He states he has had no bowel movements for 2 days. Patient denies any symptoms and has no other complaints at this time.   The history is provided by the patient. No language interpreter was used.  Groin Pain  This is a new problem. The current episode started 2 days ago. The problem occurs constantly. The problem has not changed since onset.Exacerbated by: Worsened by touch and movement. Nothing relieves the symptoms.    Past Medical History:  Diagnosis Date  . Arthritis   . Back pain with sciatica 10/04/13   left leg  . Heartburn     Patient Active Problem List   Diagnosis Date Noted  . Spinal stenosis, lumbar region, with neurogenic claudication 10/12/2013  . Herniated lumbar intervertebral disc 10/12/2013  . SINUSITIS, CHRONIC 11/30/2008    Past Surgical History:  Procedure Laterality Date  . BACK SURGERY     lumbar x 2  . CHOLECYSTECTOMY    . FOOT SURGERY    . HEMI-MICRODISCECTOMY LUMBAR LAMINECTOMY LEVEL 1 Left 10/12/2013   Procedure: REDO HEMI-LAMINECTOMY MICRODISCECTOMY L5-S1 ON LEFT ;  Surgeon: Jacki Conesonald A Gioffre, MD;  Location: [facility];  Service: Orthopedics;  Laterality: Left;  . KNEE SURGERY         Home Medications    Prior to Admission medications   Medication Sig Start Date  End Date Taking? Authorizing Provider  dicyclomine (BENTYL) 20 MG tablet Take 1 tablet (20 mg total) by mouth 2 (two) times daily. 03/06/16   Maia PlanJoshua G Long, MD    Family History No family history on file.  Social History Social History  Substance Use Topics  . Smoking status: Current Every Day Smoker    Packs/day: 1.00    Years: 22.00  . Smokeless tobacco: Never Used  . Alcohol use Yes     Comment: occasional     Allergies   Patient has no known allergies.   Review of Systems Review of Systems   Physical Exam Updated Vital Signs BP 125/95 (BP Location: Left Arm)   Pulse 108   Temp 98 F (36.7 C) (Oral)   Resp 20   Ht 5\' 11"  (1.803 m)   Wt 268 lb (121.6 kg)   SpO2 99%   BMI 37.38 kg/m   Physical Exam  Constitutional: He is oriented to person, place, and time. He appears well-developed and well-nourished.  HENT:  Head: Normocephalic and atraumatic.  Neck: Normal range of motion. Neck supple.  Cardiovascular: Normal rate.   Pulmonary/Chest: Effort normal.  Abdominal:  There is an area of erythema originating in the right inguinal region and tracking to the right lateral abdomen.  It is tender to the touch and warm to the touch.  There is questionable fluctuance.   Neurological: He is alert and oriented to person,  place, and time.  Skin: Skin is warm and dry.     ED Treatments / Results  DIAGNOSTIC STUDIES: Oxygen Saturation is 99% on RA, normal  by my interpretation.    COORDINATION OF CARE: 3:57 PM Discussed treatment plan with pt at bedside and pt agreed to plan.  Labs (all labs ordered are listed, but only abnormal results are displayed) Labs Reviewed  URINALYSIS, ROUTINE W REFLEX MICROSCOPIC - Abnormal; Notable for the following:       Result Value   Hgb urine dipstick TRACE (*)    All other components within normal limits  URINALYSIS, MICROSCOPIC (REFLEX) - Abnormal; Notable for the following:    Bacteria, UA RARE (*)    Squamous Epithelial /  LPF 0-5 (*)    All other components within normal limits    EKG  EKG Interpretation None       Radiology No results found.  Procedures Procedures (including critical care time)  Medications Ordered in ED Medications - No data to display   Initial Impression / Assessment and Plan / ED Course  I have reviewed the triage vital signs and the nursing notes.  Pertinent labs & imaging results that were available during my care of the patient were reviewed by me and considered in my medical decision making (see chart for details).  Clinical Course     Patient presents with pain in his right groin radiating to his right lateral abdomen. He has an area of apparent cellulitis in the groin. There is a firm area in the right inguinal region which appears to represent an inflamed lymph node. This is noted on the CT scan. There is no evidence for abscess or hernia. This will be treated as an abdominal wall cellulitis. He will be given Rocephin in the ER and discharged with Keflex and Bactrim.  Final Clinical Impressions(s) / ED Diagnoses   Final diagnoses:  None    New Prescriptions New Prescriptions   No medications on file   I personally performed the services described in this documentation, which was scribed in my presence. The recorded information has been reviewed and is accurate.        Geoffery Lyonsouglas Jonai Weyland, MD 07/21/16 [phone]

## 2016-07-24 ENCOUNTER — Emergency Department (HOSPITAL_BASED_OUTPATIENT_CLINIC_OR_DEPARTMENT_OTHER)
Admission: EM | Admit: 2016-07-24 | Discharge: 2016-07-24 | Disposition: A | Discharge status: Home / Self Care | Payer: Self-pay | Attending: Emergency Medicine | Admitting: Emergency Medicine

## 2016-07-24 ENCOUNTER — Encounter (HOSPITAL_BASED_OUTPATIENT_CLINIC_OR_DEPARTMENT_OTHER): Payer: Self-pay | Admitting: *Deleted

## 2016-07-24 DIAGNOSIS — R197 Diarrhea, unspecified: Secondary | ICD-10-CM | POA: Insufficient documentation

## 2016-07-24 DIAGNOSIS — L0291 Cutaneous abscess, unspecified: Secondary | ICD-10-CM

## 2016-07-24 DIAGNOSIS — L02214 Cutaneous abscess of groin: Secondary | ICD-10-CM | POA: Insufficient documentation

## 2016-07-24 DIAGNOSIS — F17200 Nicotine dependence, unspecified, uncomplicated: Secondary | ICD-10-CM | POA: Insufficient documentation

## 2016-07-24 MED ORDER — LIDOCAINE HCL (PF) 1 % IJ SOLN: 10.0000 mL | Freq: Once | INTRAMUSCULAR | Status: DC

## 2016-07-24 MED ORDER — LIDOCAINE HCL 1 % IJ SOLN
INTRAMUSCULAR | Status: AC
  Administered 2016-07-24: 20 mL
  Filled 2016-07-24: qty 20

## 2016-07-24 MED ORDER — IBUPROFEN 200 MG PO TABS
600.0000 mg | ORAL_TABLET | Freq: Once | ORAL | Status: AC
  Administered 2016-07-24: 600 mg via ORAL
  Filled 2016-07-24: qty 1

## 2016-07-24 MED ORDER — LIDOCAINE-EPINEPHRINE 2 %-1:100000 IJ SOLN: 10.0000 mL | Freq: Once | INTRAMUSCULAR | Status: DC

## 2016-07-24 NOTE — ED Provider Notes (Signed)
[facility] Provider Note   CSN: [id number] Arrival date & time: 07/24/16  1133  By signing my name below, I, Rosario AdieWilliam Andrew Hiatt, attest that this documentation has been prepared under the direction and in the presence of Raeford RazorStephen Orah Sonnen, MD. Electronically Signed: Rosario AdieWilliam Andrew Hiatt, ED Scribe. 07/24/16. 12:10 PM.  History   Chief Complaint Chief Complaint  Patient presents with  . Cellulitis   The history is provided by the patient. No language interpreter was used.    HPI Comments: Keith [name] is a 40 y.o. male with no pertinent PMHx, who presents to the Emergency Department complaining of a moderate, gradually worsening area of pain, redness, and swelling to the right-sided inguinal region onset ~5 days ago. Pt was seen in the ED for this issue ~3 days ago, and at that time he was dx'd with cellulitis of the region. CT A/P at that time was only remarkable of an inflamed lymph node of the area. He was d/c'd home at that time with prescriptions for Keflex and Bactrim. Pt has been taking both of these medications compliantly, however, he notes that this morning he had moderately increased pain to the site, with a subjective fiver, one episode of chills/diaphoresis, and additionally one episode of diarrhea secondary to his increased pain. Pt states pain is exacerbated with palpation and direct pressure. He denies any drainage from the area, or any other associated symptoms.   Past Medical History:  Diagnosis Date  . Arthritis   . Back pain with sciatica 10/04/13   left leg  . Heartburn    Patient Active Problem List   Diagnosis Date Noted  . Spinal stenosis, lumbar region, with neurogenic claudication 10/12/2013  . Herniated lumbar intervertebral disc 10/12/2013  . SINUSITIS, CHRONIC 11/30/2008   Past Surgical History:  Procedure Laterality Date  . BACK SURGERY     lumbar x 2  . CHOLECYSTECTOMY    . FOOT SURGERY    . HEMI-MICRODISCECTOMY LUMBAR LAMINECTOMY  LEVEL 1 Left 10/12/2013   Procedure: REDO HEMI-LAMINECTOMY MICRODISCECTOMY L5-S1 ON LEFT ;  Surgeon: Jacki Conesonald A Gioffre, MD;  Location: [facility];  Service: Orthopedics;  Laterality: Left;  . KNEE SURGERY      Home Medications    Prior to Admission medications   Medication Sig Start Date End Date Taking? Authorizing Provider  cephALEXin (KEFLEX) 500 MG capsule Take 1 capsule (500 mg total) by mouth 4 (four) times daily. 07/21/16   Geoffery Lyonsouglas Delo, MD  dicyclomine (BENTYL) 20 MG tablet Take 1 tablet (20 mg total) by mouth 2 (two) times daily. 03/06/16   Maia PlanJoshua G Long, MD  HYDROcodone-acetaminophen (NORCO) 5-325 MG tablet Take 1-2 tablets by mouth every 6 (six) hours as needed. 07/21/16   Geoffery Lyonsouglas Delo, MD  sulfamethoxazole-trimethoprim (BACTRIM DS,SEPTRA DS) 800-160 MG tablet Take 1 tablet by mouth 2 (two) times daily. 07/21/16 07/28/16  Geoffery Lyonsouglas Delo, MD   Family History No family history on file.  Social History Social History  Substance Use Topics  . Smoking status: Current Every Day Smoker    Packs/day: 1.00    Years: 22.00  . Smokeless tobacco: Never Used  . Alcohol use Yes     Comment: occasional   Allergies   Patient has no known allergies.  Review of Systems Review of Systems  Constitutional: Positive for chills, diaphoresis and fever (subjective).  Gastrointestinal: Positive for diarrhea.  Musculoskeletal: Positive for myalgias.  Skin: Positive for color change.  All other systems reviewed and are negative.  Physical  Exam Updated Vital Signs BP 134/81 (BP Location: Left Arm)   Pulse 88   Temp 98 F (36.7 C) (Oral)   Resp 20   Wt 270 lb 2 oz (122.5 kg)   SpO2 98%   BMI 37.67 kg/m   Physical Exam  Constitutional: He appears well-developed and well-nourished.  HENT:  Head: Normocephalic.  Right Ear: External ear normal.  Left Ear: External ear normal.  Nose: Nose normal.  Eyes: Conjunctivae are normal. Right eye exhibits no discharge. Left eye exhibits no discharge.    Neck: Normal range of motion.  Cardiovascular: Normal rate, regular rhythm and normal heart sounds.   No murmur heard. Pulmonary/Chest: Effort normal and breath sounds normal. No respiratory distress. He has no wheezes. He has no rales.  Abdominal: Soft. There is no tenderness. There is no rebound and no guarding.  Musculoskeletal: Normal range of motion. He exhibits no edema or tenderness.  Neurological: He is alert. No cranial nerve deficit. Coordination normal.  Skin: Skin is warm and dry. There is erythema. No pallor.  Small indurated region to the right inguinal region. No fluctuance, no drainage. Faint erythema extending laterally towards the right hip.   Psychiatric: He has a normal mood and affect. His behavior is normal.  Nursing note and vitals reviewed.  ED Treatments / Results  DIAGNOSTIC STUDIES: Oxygen Saturation is 98% on RA, normal by my interpretation.   COORDINATION OF CARE: 12:05 PM-Discussed next steps with pt. Pt verbalized understanding and is agreeable with the plan.   Labs (all labs ordered are listed, but only abnormal results are displayed) Labs Reviewed - No data to display  Radiology No results found.  Procedures Procedures   INCISION AND DRAINAGE PROCEDURE NOTE: Patient identification was confirmed and verbal consent was obtained. This procedure was performed by Raeford RazorStephen Jonesha Tsuchiya, MD at 12:13 PM. Site: right lateral inguinal region Sterile procedures observed Anesthetic used (type and amt): 1% Lidocaine w/o epi, 2 ml Blade size: 11 Drainage: large amount of purulent fluid Complexity: Complex Site anesthetized, incision made over site, wound drained and explored loculations, rinsed with copious amounts of normal saline, wound packed with sterile gauze, covered with dry, sterile dressing.  Pt tolerated procedure well without complications.  Instructions for care discussed verbally and pt provided with additional written instructions for homecare  and f/u.  Medications Ordered in ED Medications - No data to display  Initial Impression / Assessment and Plan / ED Course  I have reviewed the triage vital signs and the nursing notes.  Pertinent labs & imaging results that were available during my care of the patient were reviewed by me and considered in my medical decision making (see chart for details).  Clinical Course    40 year old male with an abscess was from a cellulitis to his right lower abdominal wall. There was a collection noted on bedside ultrasound. This was incised and drained. Continued antibiotics for a cellulitic component. Continued wound care and return precautions were discussed.  Final Clinical Impressions(s) / ED Diagnoses   Final diagnoses:  Abscess   New Prescriptions New Prescriptions   No medications on file   I personally preformed the services scribed in my presence. The recorded information has been reviewed is accurate. Raeford RazorStephen Kevante Lunt, MD.    Raeford RazorStephen Bethzy Hauck, MD 07/31/16 1239

## 2016-07-24 NOTE — ED Triage Notes (Signed)
He was here 3 days ago and diagnosed with cellulitis of his right abdomen. He is here for recheck of site. States he feels like he has a fever. He had diarrhea x 1 this am.

## 2016-07-24 NOTE — ED Notes (Signed)
ED Provider at bedside. 

## 2017-05-11 ENCOUNTER — Encounter (HOSPITAL_BASED_OUTPATIENT_CLINIC_OR_DEPARTMENT_OTHER): Payer: Self-pay

## 2017-05-11 ENCOUNTER — Emergency Department (HOSPITAL_BASED_OUTPATIENT_CLINIC_OR_DEPARTMENT_OTHER)
Admission: EM | Admit: 2017-05-11 | Discharge: 2017-05-11 | Disposition: A | Discharge status: Home / Self Care | Payer: Self-pay | Attending: Emergency Medicine | Admitting: Emergency Medicine

## 2017-05-11 DIAGNOSIS — F17200 Nicotine dependence, unspecified, uncomplicated: Secondary | ICD-10-CM | POA: Insufficient documentation

## 2017-05-11 DIAGNOSIS — B350 Tinea barbae and tinea capitis: Secondary | ICD-10-CM | POA: Insufficient documentation

## 2017-05-11 MED ORDER — CLOTRIMAZOLE 1 % EX CREA: TOPICAL_CREAM | CUTANEOUS | 0 refills | Status: DC

## 2017-05-11 NOTE — ED Notes (Signed)
ED Provider at bedside. 

## 2017-05-11 NOTE — ED Triage Notes (Signed)
C/o rash to inner thighs, right buttock and abd x 1 month-worse x 10 days-NAD-steady gait

## 2017-05-11 NOTE — ED Notes (Signed)
Pt verbalizes understanding of d/c instructions and denies any further needs at this time. 

## 2017-05-11 NOTE — ED Provider Notes (Signed)
[facility] EMERGENCY DEPARTMENT Provider Note   CSN: [id number] Arrival date & time: 05/11/17  1809     History   Chief Complaint Chief Complaint  Patient presents with  . Rash    HPI Keith [name] is a 41 y.o. male.  The history is provided by the patient.  Rash   This is a new problem. Episode onset: 8-10 days ago. The problem has been gradually worsening. The problem is associated with an unknown factor. There has been no fever. The rash is present on the groin. The pain is at a severity of 0/10. The patient is experiencing no pain. The pain has been constant since onset. Associated symptoms include itching. Treatments tried: hydrocortisone cream. The treatment provided no relief.    Past Medical History:  Diagnosis Date  . Arthritis   . Back pain with sciatica 10/04/13   left leg  . Heartburn     Patient Active Problem List   Diagnosis Date Noted  . Spinal stenosis, lumbar region, with neurogenic claudication 10/12/2013  . Herniated lumbar intervertebral disc 10/12/2013  . SINUSITIS, CHRONIC 11/30/2008    Past Surgical History:  Procedure Laterality Date  . BACK SURGERY     lumbar x 2  . CHOLECYSTECTOMY    . FOOT SURGERY    . HEMI-MICRODISCECTOMY LUMBAR LAMINECTOMY LEVEL 1 Left 10/12/2013   Procedure: REDO HEMI-LAMINECTOMY MICRODISCECTOMY L5-S1 ON LEFT ;  Surgeon: Jacki Cones, MD;  Location: [facility];  Service: Orthopedics;  Laterality: Left;  . KNEE SURGERY         Home Medications    Prior to Admission medications   Not on File    Family History No family history on file.  Social History Social History  Substance Use Topics  . Smoking status: Current Every Day Smoker    Packs/day: 1.00    Years: 22.00  . Smokeless tobacco: Never Used  . Alcohol use Yes     Comment: occ     Allergies   Patient has no known allergies.   Review of Systems Review of Systems  Skin: Positive for itching and rash.  All other systems  reviewed and are negative.    Physical Exam Updated Vital Signs BP 133/61 (BP Location: Left Arm)   Pulse 77   Temp 98 F (36.7 C) (Oral)   Resp 16   Ht  (1.803 m)   Wt 121.1 kg (267 lb)   SpO2 98%   BMI 37.24 kg/m   Physical Exam  Constitutional: He is oriented to person, place, and time. He appears well-developed and well-nourished. No distress.  HENT:  Head: Normocephalic and atraumatic.  Eyes: Pupils are equal, round, and reactive to light. EOM are normal.  Cardiovascular: Normal rate.   Pulmonary/Chest: Effort normal.  Neurological: He is alert and oriented to person, place, and time.  Skin: Capillary refill takes less than 2 seconds. Rash noted.  Large raised circular patches with centralized clearing in the groin and right medial thigh  Psychiatric: He has a normal mood and affect. His behavior is normal.  Nursing note and vitals reviewed.    ED Treatments / Results  Labs (all labs ordered are listed, but only abnormal results are displayed) Labs Reviewed - No data to display  EKG  EKG Interpretation None       Radiology No results found.  Procedures Procedures (including critical care time)  Medications Ordered in ED Medications - No data to display   Initial Impression /  Assessment and Plan / ED Course  I have reviewed the triage vital signs and the nursing notes.  Pertinent labs & imaging results that were available during my care of the patient were reviewed by me and considered in my medical decision making (see chart for details).     Patient with symptoms consistent with ringworm. He has been using hydrocortisone cream on it and is worsening. Patient instructed to stop hydrocortisone cream and given Lotrimin.  Final Clinical Impressions(s) / ED Diagnoses   Final diagnoses:  Tinea capitis    New Prescriptions New Prescriptions   CLOTRIMAZOLE (LOTRIMIN) 1 % CREAM    Apply to affected area 2 times daily until gone       Gwyneth Sprout, MD 05/11/17 1929

## 2017-08-14 ENCOUNTER — Telehealth (INDEPENDENT_AMBULATORY_CARE_PROVIDER_SITE_OTHER): Payer: Self-pay | Admitting: Radiology

## 2017-08-14 ENCOUNTER — Emergency Department (HOSPITAL_COMMUNITY)
Admission: EM | Admit: 2017-08-14 | Discharge: 2017-08-14 | Disposition: A | Discharge status: Home / Self Care | Payer: Self-pay | Attending: Emergency Medicine | Admitting: Emergency Medicine

## 2017-08-14 ENCOUNTER — Encounter (HOSPITAL_COMMUNITY): Payer: Self-pay | Admitting: Internal Medicine

## 2017-08-14 DIAGNOSIS — M5440 Lumbago with sciatica, unspecified side: Secondary | ICD-10-CM | POA: Insufficient documentation

## 2017-08-14 DIAGNOSIS — F17200 Nicotine dependence, unspecified, uncomplicated: Secondary | ICD-10-CM | POA: Insufficient documentation

## 2017-08-14 DIAGNOSIS — Z79899 Other long term (current) drug therapy: Secondary | ICD-10-CM | POA: Insufficient documentation

## 2017-08-14 MED ORDER — DIAZEPAM 5 MG PO TABS
10.0000 mg | ORAL_TABLET | Freq: Once | ORAL | Status: AC
  Administered 2017-08-14: 10 mg via ORAL
  Filled 2017-08-14: qty 2

## 2017-08-14 MED ORDER — DIAZEPAM 2 MG PO TABS: 2.0000 mg | ORAL_TABLET | ORAL | 0 refills | Status: DC | PRN

## 2017-08-14 MED ORDER — OXYCODONE-ACETAMINOPHEN 5-325 MG PO TABS
2.0000 | ORAL_TABLET | Freq: Once | ORAL | Status: AC
  Administered 2017-08-14: 2 via ORAL
  Filled 2017-08-14: qty 2

## 2017-08-14 MED ORDER — KETOROLAC TROMETHAMINE 60 MG/2ML IM SOLN
60.0000 mg | Freq: Once | INTRAMUSCULAR | Status: AC
  Administered 2017-08-14: 60 mg via INTRAMUSCULAR
  Filled 2017-08-14: qty 2

## 2017-08-14 MED ORDER — DEXAMETHASONE SODIUM PHOSPHATE 10 MG/ML IJ SOLN
10.0000 mg | Freq: Once | INTRAMUSCULAR | Status: AC
  Administered 2017-08-14: 10 mg via INTRAMUSCULAR
  Filled 2017-08-14: qty 1

## 2017-08-14 MED ORDER — HYDROMORPHONE HCL 1 MG/ML IJ SOLN
1.0000 mg | Freq: Once | INTRAMUSCULAR | Status: AC
  Administered 2017-08-14: 1 mg via INTRAMUSCULAR
  Filled 2017-08-14: qty 1

## 2017-08-14 MED ORDER — PREDNISONE 10 MG (21) PO TBPK: ORAL_TABLET | Freq: Every day | ORAL | 0 refills | Status: DC

## 2017-08-14 MED ORDER — OXYCODONE-ACETAMINOPHEN 5-325 MG PO TABS: 2.0000 | ORAL_TABLET | ORAL | 0 refills | Status: DC | PRN

## 2017-08-14 NOTE — Telephone Encounter (Signed)
Patients father calling triage line today. States his son Keith [name] is in severe pain, [facility] and was referred to our office today. Dr. Roda ShuttersXu is on call, wanting appointment ASAP. Made appointment this Tuesday at 8am with Dr.Xu

## 2017-08-14 NOTE — ED Notes (Signed)
Patient ambulating to restroom with family assistance. Patient gait steady, but having a difficult time walking d/t pain.

## 2017-08-14 NOTE — ED Notes (Signed)
Bed: [facility] Expected date:  Expected time:  Means of arrival:  Comments: 

## 2017-08-14 NOTE — ED Provider Notes (Signed)
[facility]-EMERGENCY DEPT Provider Note   CSN: [id number] Arrival date & time: 08/14/17  0865     History   Chief Complaint Chief Complaint  Patient presents with  . Back Pain    HPI Keith [name] is a 42 y.o. male.  42 year old male presents with several days of increasing lower lumbar spine pain worse on the left side which does radiate down his left leg.  Denies any bowel or bladder dysfunction.  Denies any direct trauma.  Does have a history of spinal stenosis in the past.  Denies any foot drop.  Pain is characterized as sharp and worse with any movement and better with remaining still's.  He has been using Aleve without relief.  Denies any urinary symptoms.      Past Medical History:  Diagnosis Date  . Arthritis   . Back pain with sciatica 10/04/13   left leg  . Heartburn     Patient Active Problem List   Diagnosis Date Noted  . Spinal stenosis, lumbar region, with neurogenic claudication 10/12/2013  . Herniated lumbar intervertebral disc 10/12/2013  . SINUSITIS, CHRONIC 11/30/2008    Past Surgical History:  Procedure Laterality Date  . BACK SURGERY     lumbar x 2  . CHOLECYSTECTOMY    . FOOT SURGERY    . HEMI-MICRODISCECTOMY LUMBAR LAMINECTOMY LEVEL 1 Left 10/12/2013   Procedure: REDO HEMI-LAMINECTOMY MICRODISCECTOMY L5-S1 ON LEFT ;  Surgeon: Jacki Cones, MD;  Location: [facility];  Service: Orthopedics;  Laterality: Left;  . KNEE SURGERY         Home Medications    Prior to Admission medications   Medication Sig Start Date End Date Taking? Authorizing Provider  clotrimazole (LOTRIMIN) 1 % cream Apply to affected area 2 times daily until gone 05/11/17   Gwyneth Sprout, MD    Family History No family history on file.  Social History Social History   Tobacco Use  . Smoking status: Current Every Day Smoker    Packs/day: 1.00    Years: 22.00    Pack years: 22.00  . Smokeless tobacco: Never Used  Substance Use Topics    . Alcohol use: Yes    Comment: occ  . Drug use: No     Allergies   Patient has no known allergies.   Review of Systems Review of Systems  All other systems reviewed and are negative.    Physical Exam Updated Vital Signs BP 139/77 (BP Location: Left Arm)   Pulse 71   Temp 97.6 F (36.4 C) (Oral)   Resp 18   SpO2 96%   Physical Exam  Constitutional: He is oriented to person, place, and time. He appears well-developed and well-nourished.  Non-toxic appearance. No distress.  HENT:  Head: Normocephalic and atraumatic.  Eyes: Conjunctivae, EOM and lids are normal. Pupils are equal, round, and reactive to light.  Neck: Normal range of motion. Neck supple. No tracheal deviation present. No thyroid mass present.  Cardiovascular: Normal rate, regular rhythm and normal heart sounds. Exam reveals no gallop.  No murmur heard. Pulmonary/Chest: Effort normal and breath sounds normal. No stridor. No respiratory distress. He has no decreased breath sounds. He has no wheezes. He has no rhonchi. He has no rales.  Abdominal: Soft. Normal appearance and bowel sounds are normal. He exhibits no distension. There is no tenderness. There is no rebound and no CVA tenderness.  Musculoskeletal: Normal range of motion. He exhibits no edema or tenderness.  Pain with straight  leg raise on the left side  Neurological: He is alert and oriented to person, place, and time. He has normal strength. No cranial nerve deficit or sensory deficit. GCS eye subscore is 4. GCS verbal subscore is 5. GCS motor subscore is 6.  Reflex Scores:      Patellar reflexes are 2+ on the right side and 2+ on the left side. Skin: Skin is warm and dry. No abrasion and no rash noted.  Psychiatric: He has a normal mood and affect. His speech is normal and behavior is normal.  Nursing note and vitals reviewed.    ED Treatments / Results  Labs (all labs ordered are listed, but only abnormal results are displayed) Labs Reviewed -  No data to display  EKG  EKG Interpretation None       Radiology No results found.  Procedures Procedures (including critical care time)  Medications Ordered in ED Medications  ketorolac (TORADOL) injection 60 mg (not administered)  diazepam (VALIUM) tablet 10 mg (not administered)  oxyCODONE-acetaminophen (PERCOCET/ROXICET) 5-325 MG per tablet 2 tablet (not administered)     Initial Impression / Assessment and Plan / ED Course  I have reviewed the triage vital signs and the nursing notes.  Pertinent labs & imaging results that were available during my care of the patient were reviewed by me and considered in my medical decision making (see chart for details).     Patient medicated x2 here for pain and now feels better.  Suspect musculoskeletal etiology versus worsening known history of chronic back pain.  No red flags for cauda equina.  Stable for discharge  Final Clinical Impressions(s) / ED Diagnoses   Final diagnoses:  None    ED Discharge Orders    None       Lorre NickAllen, Izabel Chim, MD 08/14/17 1234

## 2017-08-14 NOTE — ED Triage Notes (Signed)
Patient complaining of increasing lower back pain x1 week that radiates down legs to ankles. [facility] called out after patient arrived to work and patient found laying supine for comfort. Most recent back surgery was 3 years ago. Patient has had four total surgical procedures for same pain.

## 2017-08-14 NOTE — ED Notes (Signed)
ED Provider at bedside. 

## 2017-08-18 ENCOUNTER — Encounter (INDEPENDENT_AMBULATORY_CARE_PROVIDER_SITE_OTHER): Payer: Self-pay | Admitting: Orthopaedic Surgery

## 2017-08-18 ENCOUNTER — Other Ambulatory Visit (INDEPENDENT_AMBULATORY_CARE_PROVIDER_SITE_OTHER): Payer: Self-pay

## 2017-08-18 ENCOUNTER — Ambulatory Visit (INDEPENDENT_AMBULATORY_CARE_PROVIDER_SITE_OTHER): Payer: Self-pay

## 2017-08-18 ENCOUNTER — Telehealth (INDEPENDENT_AMBULATORY_CARE_PROVIDER_SITE_OTHER): Payer: Self-pay | Admitting: Orthopaedic Surgery

## 2017-08-18 ENCOUNTER — Ambulatory Visit (INDEPENDENT_AMBULATORY_CARE_PROVIDER_SITE_OTHER): Payer: Self-pay | Admitting: Orthopaedic Surgery

## 2017-08-18 DIAGNOSIS — M545 Low back pain: Secondary | ICD-10-CM

## 2017-08-18 MED ORDER — METHOCARBAMOL 750 MG PO TABS: 750.0000 mg | ORAL_TABLET | Freq: Three times a day (TID) | ORAL | 0 refills | Status: DC | PRN

## 2017-08-18 MED ORDER — OXYCODONE-ACETAMINOPHEN 5-325 MG PO TABS: 1.0000 | ORAL_TABLET | Freq: Two times a day (BID) | ORAL | 0 refills | Status: DC | PRN

## 2017-08-18 MED ORDER — IBUPROFEN 800 MG PO TABS: 800.0000 mg | ORAL_TABLET | Freq: Three times a day (TID) | ORAL | 2 refills | Status: DC | PRN

## 2017-08-18 NOTE — Progress Notes (Addendum)
Office Visit Note   Patient: Keith AndreasJeffrey W Moosman           Date of Birth: February 14, 1976           MRN: [id number] Visit Date: 08/18/2017              Requested by: No referring provider defined for this encounter. PCP: Patient, No Pcp Per   Assessment & Plan: Visit Diagnoses:  1. Acute low back pain, unspecified back pain laterality, with sciatica presence unspecified     Plan: Impression 42 year old with left leg radiculopathy with previous back surgery.  Recommend MRI to rule out structural abnormalities.  Prescription for ibuprofen, Robaxin, Percocet.  Patient is currently taking prednisone taper.  Follow-up after the MRI.  Follow-Up Instructions: Return in about 2 weeks (around 09/01/2017).   Orders:  Orders Placed This Encounter  Procedures  . XR Lumbar Spine 2-3 Views  . MR Lumbar Spine w/o contrast   Meds ordered this encounter  Medications  . ibuprofen (ADVIL,MOTRIN) 800 MG tablet    Sig: Take 1 tablet (800 mg total) by mouth every 8 (eight) hours as needed.    Dispense:  30 tablet    Refill:  2  . methocarbamol (ROBAXIN) 750 MG tablet    Sig: Take 1 tablet (750 mg total) by mouth every 8 (eight) hours as needed for muscle spasms.    Dispense:  60 tablet    Refill:  0  . oxyCODONE-acetaminophen (PERCOCET) 5-325 MG tablet    Sig: Take 1-2 tablets by mouth 2 (two) times daily as needed for severe pain.    Dispense:  20 tablet    Refill:  0      Procedures: No procedures performed   Clinical Data: No additional findings.   Subjective: Chief Complaint  Patient presents with  . Lower Back - Pain    Patient comes in today for low back pain and left leg radiculopathy.  This started a few days ago.  He does have a history of decompression surgery with Dr. Darrelyn HillockGioffre in 2015.  He has severe pain that radiates all the way to his left foot.  He has weakness.  Denies any bowel or bladder dysfunction.Marland Kitchen.  He went to the ED on Friday.    Review of Systems  Constitutional:  Negative.   All other systems reviewed and are negative.    Objective: Vital Signs: There were no vitals taken for this visit.  Physical Exam  Constitutional: He is oriented to person, place, and time. He appears well-developed and well-nourished.  HENT:  Head: Normocephalic and atraumatic.  Eyes: Pupils are equal, round, and reactive to light.  Neck: Neck supple.  Pulmonary/Chest: Effort normal.  Abdominal: Soft.  Musculoskeletal: Normal range of motion.  Neurological: He is alert and oriented to person, place, and time.  Skin: Skin is warm.  Psychiatric: He has a normal mood and affect. His behavior is normal. Judgment and thought content normal.  Nursing note and vitals reviewed.   Ortho Exam Exam of the left lower extremity shows significant weakness with hip flexion and ankle dorsiflexion and plantar flexion.  Reflexes are symmetric. Specialty Comments:  No specialty comments available.  Imaging: Xr Lumbar Spine 2-3 Views  Result Date: 08/18/2017 Mild lumbar spondylosis.  Overall well preserved and disc spaces normal curvature    PMFS History: Patient Active Problem List   Diagnosis Date Noted  . Spinal stenosis, lumbar region, with neurogenic claudication 10/12/2013  . Herniated lumbar intervertebral disc 10/12/2013  .  SINUSITIS, CHRONIC 11/30/2008   Past Medical History:  Diagnosis Date  . Arthritis   . Back pain with sciatica 10/04/13   left leg  . Heartburn     History reviewed. No pertinent family history.  Past Surgical History:  Procedure Laterality Date  . BACK SURGERY     lumbar x 2  . CHOLECYSTECTOMY    . FOOT SURGERY    . HEMI-MICRODISCECTOMY LUMBAR LAMINECTOMY LEVEL 1 Left 10/12/2013   Procedure: REDO HEMI-LAMINECTOMY MICRODISCECTOMY L5-S1 ON LEFT ;  Surgeon: Jacki Cones, MD;  Location: [facility];  Service: Orthopedics;  Laterality: Left;  . KNEE SURGERY     Social History   Occupational History  . Not on file  Tobacco Use  . Smoking  status: Current Every Day Smoker    Packs/day: 1.00    Years: 22.00    Pack years: 22.00  . Smokeless tobacco: Never Used  Substance and Sexual Activity  . Alcohol use: Yes    Comment: occ  . Drug use: No  . Sexual activity: Not on file

## 2017-08-18 NOTE — Progress Notes (Signed)
Faxed Rx for Robaxin to [facility] on [address] instead per patients request.

## 2017-08-18 NOTE — Telephone Encounter (Signed)
Diane pt step mom [facility] to [facility] [place] [facility] and it will take 2 days before he can receive it, and he is needing it NOW. Please advise   Diane CB:[phone]

## 2017-08-18 NOTE — Telephone Encounter (Signed)
Patient was seen this morning, Dr. Roda ShuttersXu ordered an MRI but patient is self pay and his mother is wondering about how much it will be out of pocket and since it doesn't have to get authorized through an insurance company, will it be sooner? She states patient can barely walk. If you could call her to answer these questions and advise her. Diane CB # [phone]

## 2017-08-18 NOTE — Telephone Encounter (Signed)
Refaxed Rx (Robaxin) Diane aware.

## 2017-08-23 ENCOUNTER — Ambulatory Visit
Admission: RE | Admit: 2017-08-23 | Discharge: 2017-08-23 | Disposition: A | Discharge status: Home / Self Care | Payer: No Typology Code available for payment source | Source: Ambulatory Visit | Attending: Orthopaedic Surgery | Admitting: Orthopaedic Surgery

## 2017-08-23 DIAGNOSIS — M545 Low back pain: Secondary | ICD-10-CM

## 2017-08-25 ENCOUNTER — Encounter (INDEPENDENT_AMBULATORY_CARE_PROVIDER_SITE_OTHER): Payer: Self-pay | Admitting: Orthopaedic Surgery

## 2017-08-25 ENCOUNTER — Ambulatory Visit (INDEPENDENT_AMBULATORY_CARE_PROVIDER_SITE_OTHER): Payer: Self-pay | Admitting: Orthopaedic Surgery

## 2017-08-25 DIAGNOSIS — M545 Low back pain: Secondary | ICD-10-CM

## 2017-08-25 NOTE — Addendum Note (Signed)
Addended by: Albertina ParrGARCIA, Edrian Melucci on: 08/25/2017 09:36 AM   Modules accepted: Orders

## 2017-08-25 NOTE — Progress Notes (Signed)
   Office Visit Note   Patient: Keith [name]           Date of Birth: 06-14-1976           MRN: [id number] Visit Date: 08/25/2017              Requested by: No referring provider defined for this encounter. PCP: Patient, No Pcp Per   Assessment & Plan: Visit Diagnoses:  1. Acute low back pain, unspecified back pain laterality, with sciatica presence unspecified     Plan: MRI is consistent with lateral recess stenosis on the left side at L4-L5 and L5-S1.  Recommend epidural steroid injection with Dr. Alvester MorinNewton.  Follow-up as needed.  Follow-Up Instructions: Return if symptoms worsen or fail to improve.   Orders:  No orders of the defined types were placed in this encounter.  No orders of the defined types were placed in this encounter.     Procedures: No procedures performed   Clinical Data: No additional findings.   Subjective: Chief Complaint  Patient presents with  . Lower Back - Pain    Patient comes in today to review his lumbar spine MRI.    Review of Systems   Objective: Vital Signs: There were no vitals taken for this visit.  Physical Exam  Ortho Exam Exam is stable Specialty Comments:  No specialty comments available.  Imaging: No results found.   PMFS History: Patient Active Problem List   Diagnosis Date Noted  . Spinal stenosis, lumbar region, with neurogenic claudication 10/12/2013  . Herniated lumbar intervertebral disc 10/12/2013  . SINUSITIS, CHRONIC 11/30/2008   Past Medical History:  Diagnosis Date  . Arthritis   . Back pain with sciatica 10/04/13   left leg  . Heartburn     History reviewed. No pertinent family history.  Past Surgical History:  Procedure Laterality Date  . BACK SURGERY     lumbar x 2  . CHOLECYSTECTOMY    . FOOT SURGERY    . HEMI-MICRODISCECTOMY LUMBAR LAMINECTOMY LEVEL 1 Left 10/12/2013   Procedure: REDO HEMI-LAMINECTOMY MICRODISCECTOMY L5-S1 ON LEFT ;  Surgeon: Jacki Conesonald A Gioffre, MD;  Location: [facility]  [facility];  Service: Orthopedics;  Laterality: Left;  . KNEE SURGERY     Social History   Occupational History  . Not on file  Tobacco Use  . Smoking status: Current Every Day Smoker    Packs/day: 1.00    Years: 22.00    Pack years: 22.00  . Smokeless tobacco: Never Used  Substance and Sexual Activity  . Alcohol use: Yes    Comment: occ  . Drug use: No  . Sexual activity: Not on file

## 2017-08-27 ENCOUNTER — Telehealth (INDEPENDENT_AMBULATORY_CARE_PROVIDER_SITE_OTHER): Payer: Self-pay | Admitting: Orthopaedic Surgery

## 2017-08-27 NOTE — Telephone Encounter (Signed)
Patient came in to leave forms and state he was having injections on the 18th of Feb and due to go back to work on 09/01/17.  Patient wants to know if he needs to wait until after injections to return to work.  Please call patient to advise

## 2017-08-27 NOTE — Telephone Encounter (Signed)
Ok to go back to work before injection

## 2017-08-27 NOTE — Telephone Encounter (Signed)
Please advise 

## 2017-08-28 NOTE — Telephone Encounter (Signed)
Dianne (patients step mother) needs a call back from you to give you some additional information, needs work note to write him as light duty instead of completely out of work. Please give her a call back when possible # [phone]

## 2017-08-28 NOTE — Telephone Encounter (Signed)
He has a weak left leg. He drives a truck at work.

## 2017-08-28 NOTE — Telephone Encounter (Signed)
Spoke to mom.  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  If you could also call him and schedule a fu appt with dr. Roda ShuttersXu for after injection.  Info for where to send note is below.  Fax# [phone] Attn: Genevie CheshireBilly

## 2017-09-01 ENCOUNTER — Ambulatory Visit (INDEPENDENT_AMBULATORY_CARE_PROVIDER_SITE_OTHER): Payer: Self-pay | Admitting: Orthopaedic Surgery

## 2017-09-01 ENCOUNTER — Encounter (INDEPENDENT_AMBULATORY_CARE_PROVIDER_SITE_OTHER): Payer: Self-pay

## 2017-09-01 NOTE — Telephone Encounter (Signed)
APPT MADE AS WELL

## 2017-09-01 NOTE — Telephone Encounter (Signed)
NOTE FAXED

## 2017-09-01 NOTE — Telephone Encounter (Signed)
Corrected Fax # is [phone].  Please refax his note to this number.  Thank you.

## 2017-09-01 NOTE — Telephone Encounter (Signed)
Could that note be faxed to [phone] Attn:Billy

## 2017-09-01 NOTE — Telephone Encounter (Signed)
FAXED TO CORRECTED FAX NUMBER

## 2017-09-14 ENCOUNTER — Encounter (INDEPENDENT_AMBULATORY_CARE_PROVIDER_SITE_OTHER): Payer: Self-pay | Admitting: Physical Medicine and Rehabilitation

## 2017-09-14 ENCOUNTER — Ambulatory Visit (INDEPENDENT_AMBULATORY_CARE_PROVIDER_SITE_OTHER): Payer: Self-pay

## 2017-09-14 ENCOUNTER — Ambulatory Visit (INDEPENDENT_AMBULATORY_CARE_PROVIDER_SITE_OTHER): Payer: Self-pay | Admitting: Physical Medicine and Rehabilitation

## 2017-09-14 VITALS — BP 136/91 | HR 94 | Temp 98.1°F

## 2017-09-14 DIAGNOSIS — M5416 Radiculopathy, lumbar region: Secondary | ICD-10-CM

## 2017-09-14 DIAGNOSIS — M961 Postlaminectomy syndrome, not elsewhere classified: Secondary | ICD-10-CM

## 2017-09-14 MED ORDER — BETAMETHASONE SOD PHOS & ACET 6 (3-3) MG/ML IJ SUSP
12.0000 mg | Freq: Once | INTRAMUSCULAR | Status: AC
  Administered 2017-09-14: 12 mg

## 2017-09-14 NOTE — Patient Instructions (Signed)

## 2017-09-14 NOTE — Progress Notes (Deleted)
Pt states a sharp intermittent pain that goes down the left leg. Pt states pain has been going on since August 14, 2017. Pt states going to the bathroom or sitting makes pain worse. Pt states standing for short periods of time and laying down makes pain better. +Driver, -BT, -Dye Allergies.

## 2017-09-14 NOTE — Procedures (Signed)
S1 Lumbosacral Transforaminal Epidural Steroid Injection - Sub-Pedicular Approach with Fluoroscopic Guidance   Patient: Keith AndreasJeffrey W Sinkler      Date of Birth: 09-12-1975 MRN: [id number] PCP: Patient, No Pcp Per      Visit Date: 09/14/2017   Universal Protocol:    Date/Time: 02/18/191:21 PM  Consent Given By: the patient  Position:  PRONE  Additional Comments: Vital signs were monitored before and after the procedure. Patient was prepped and draped in the usual sterile fashion. The correct patient, procedure, and site was verified.   Injection Procedure Details:  Procedure Site One Meds Administered:  Meds ordered this encounter  Medications  . betamethasone acetate-betamethasone sodium phosphate (CELESTONE) injection 12 mg    Laterality: Left  Location/Site:  S1 Foramen   Needle size: 22 G  Needle type: Spinal  Needle Placement: Transforaminal  Findings:   -Comments: Excellent flow of contrast along the nerve and into the epidural space.  Initial entry towards the S1 oramen fcaused a brief paresthesia in an S1 distribution.  Procedure Details: After squaring off the sacral end-plate to get a true AP view, the C-arm was positioned so that the best possible view of the S1 foramen was visualized. The soft tissues overlying this structure were infiltrated with 2-3 ml. of 1% Lidocaine without Epinephrine.    The spinal needle was inserted toward the target using a "trajectory" view along the fluoroscope beam.  Under AP and lateral visualization, the needle was advanced so it did not puncture dura. Biplanar projections were used to confirm position. Aspiration was confirmed to be negative for CSF and/or blood. A 1-2 ml. volume of Isovue-250 was injected and flow of contrast was noted at each level. Radiographs were obtained for documentation purposes.   After attaining the desired flow of contrast documented above, a 0.5 to 1.0 ml test dose of 0.25% Marcaine was injected into  each respective transforaminal space.  The patient was observed for 90 seconds post injection.  After no sensory deficits were reported, and normal lower extremity motor function was noted,   the above injectate was administered so that equal amounts of the injectate were placed at each foramen (level) into the transforaminal epidural space.   Additional Comments:  The patient tolerated the procedure well No complications occurred Dressing: Band-Aid    Post-procedure details: Patient was observed during the procedure. Post-procedure instructions were reviewed.  Patient left the clinic in stable condition.

## 2017-09-14 NOTE — Progress Notes (Signed)
Keith [name] - 42 y.o. male MRN [id number]  Date of birth: Jul 25, 1976  Office Visit Note: Visit Date: 09/14/2017 PCP: Patient, No Pcp Per Referred by: No ref. provider found  Subjective: Chief Complaint  Patient presents with  . Left Leg - Pain   HPI: Keith [name] is a 42 year old gentleman lumbar discectomy and laminectomies performed by Dr. Darrelyn HillockGioffre he has had more acute onset of left radicular leg pain in S1 distribution followed by Dr. Roda ShuttersXu in our office.  MRI evidence of lateral recess narrowing at L4-5 and L5-S1 with recurrent disc herniation at L5-S1 which could cause an S1 radicular pattern which is consistent with his complaints.    ROS Otherwise per HPI.  Assessment & Plan: Visit Diagnoses:  1. Lumbar radiculopathy   2. Post laminectomy syndrome     Plan: Findings:  S1 transforaminal epidural steroid injection.  Patient did have brief paresthesia in an S1 distribution but with successful injection.    Meds & Orders:  Meds ordered this encounter  Medications  . betamethasone acetate-betamethasone sodium phosphate (CELESTONE) injection 12 mg    Orders Placed This Encounter  Procedures  . XR C-ARM NO REPORT  . Epidural Steroid injection    Follow-up: Return if symptoms worsen or fail to improve.   Procedures: No procedures performed  S1 Lumbosacral Transforaminal Epidural Steroid Injection - Sub-Pedicular Approach with Fluoroscopic Guidance   Patient: Keith AndreasJeffrey W [name]      Date of Birth: Jul 25, 1976 MRN: [id number] PCP: Patient, No Pcp Per      Visit Date: 09/14/2017   Universal Protocol:    Date/Time: 02/18/191:21 PM  Consent Given By: the patient  Position:  PRONE  Additional Comments: Vital signs were monitored before and after the procedure. Patient was prepped and draped in the usual sterile fashion. The correct patient, procedure, and site was verified.   Injection Procedure Details:  Procedure Site One Meds Administered:  Meds ordered this  encounter  Medications  . betamethasone acetate-betamethasone sodium phosphate (CELESTONE) injection 12 mg    Laterality: Left  Location/Site:  S1 Foramen   Needle size: 22 G  Needle type: Spinal  Needle Placement: Transforaminal  Findings:   -Comments: Excellent flow of contrast along the nerve and into the epidural space.  Initial entry towards the S1 oramen fcaused a brief paresthesia in an S1 distribution.  Procedure Details: After squaring off the sacral end-plate to get a true AP view, the C-arm was positioned so that the best possible view of the S1 foramen was visualized. The soft tissues overlying this structure were infiltrated with 2-3 ml. of 1% Lidocaine without Epinephrine.    The spinal needle was inserted toward the target using a "trajectory" view along the fluoroscope beam.  Under AP and lateral visualization, the needle was advanced so it did not puncture dura. Biplanar projections were used to confirm position. Aspiration was confirmed to be negative for CSF and/or blood. A 1-2 ml. volume of Isovue-250 was injected and flow of contrast was noted at each level. Radiographs were obtained for documentation purposes.   After attaining the desired flow of contrast documented above, a 0.5 to 1.0 ml test dose of 0.25% Marcaine was injected into each respective transforaminal space.  The patient was observed for 90 seconds post injection.  After no sensory deficits were reported, and normal lower extremity motor function was noted,   the above injectate was administered so that equal amounts of the injectate were placed at each foramen (level) into the  transforaminal epidural space.   Additional Comments:  The patient tolerated the procedure well No complications occurred Dressing: Band-Aid    Post-procedure details: Patient was observed during the procedure. Post-procedure instructions were reviewed.  Patient left the clinic in stable condition.   Clinical  History: MRI LUMBAR SPINE WITHOUT CONTRAST  TECHNIQUE: Multiplanar, multisequence MR imaging of the lumbar spine was performed. No intravenous contrast was administered.  COMPARISON:  Radiography 08/18/2017.  MRI 05/29/2009.  FINDINGS: Segmentation:  5 lumbar type vertebral bodies.  Alignment:  Normal  Vertebrae:  No fracture or primary bone lesion.  Conus medullaris and cauda equina: Conus extends to the L1 level. Conus and cauda equina appear normal.  Paraspinal and other soft tissues: Negative  Disc levels:  No abnormality at L2-3 or above.  L3-4: No disc abnormality. Mild facet arthritis with small posterior synovial cysts. No canal encroachment.  L4-5: Previous laminectomy. Disc degeneration with annular fissures and annular bulging. This indents the thecal sac slightly. There is stenosis of both lateral recesses that could affect either L5 nerve, more likely the left. No compressive central canal stenosis due to the decompression. No foraminal extension.  L5-S1: Left hemilaminectomy. Disc degeneration with residual or recurrent left posterolateral herniation that could affect the left S1 nerve. No central canal stenosis. No foraminal encroachment. Findings are slightly worsened compared to the study of 2010.  IMPRESSION: L4-5: Interval wide laminectomy. Disc degeneration with annular fissures and annular bulging. Stenosis of the lateral recesses left more than right. Left L5 nerve compression could occur. Central canal sufficiently decompressed because of the laminectomy.  L5-S1: Distant left hemilaminectomy. Disc degeneration with recurrent left posterolateral herniation that could affect the left S1 nerve. This is slightly more prominent than was seen on the study of 2010.   Electronically Signed   By: Paulina Fusi M.D.   On: 08/23/2017 09:24  He reports that he has been smoking.  He has a 22.00 pack-year smoking history. he has never used  smokeless tobacco. No results for input(s): HGBA1C, LABURIC in the last 8760 hours.  Objective:  VS:  HT:    WT:   BMI:     BP:(!) 136/91  HR:94bpm  TEMP:98.1 F (36.7 C)(Oral)  RESP:96 % Physical Exam  Ortho Exam Imaging: Xr C-arm No Report  Result Date: 09/14/2017 Please see Notes or Procedures tab for imaging impression.   Past Medical/Family/Surgical/Social History: Medications & Allergies reviewed per EMR Patient Active Problem List   Diagnosis Date Noted  . Spinal stenosis, lumbar region, with neurogenic claudication 10/12/2013  . Herniated lumbar intervertebral disc 10/12/2013  . SINUSITIS, CHRONIC 11/30/2008   Past Medical History:  Diagnosis Date  . Arthritis   . Back pain with sciatica 10/04/13   left leg  . Heartburn    History reviewed. No pertinent family history. Past Surgical History:  Procedure Laterality Date  . BACK SURGERY     lumbar x 2  . CHOLECYSTECTOMY    . FOOT SURGERY    . HEMI-MICRODISCECTOMY LUMBAR LAMINECTOMY LEVEL 1 Left 10/12/2013   Procedure: REDO HEMI-LAMINECTOMY MICRODISCECTOMY L5-S1 ON LEFT ;  Surgeon: Jacki Cones, MD;  Location: [facility];  Service: Orthopedics;  Laterality: Left;  . KNEE SURGERY     Social History   Occupational History  . Not on file  Tobacco Use  . Smoking status: Current Every Day Smoker    Packs/day: 1.00    Years: 22.00    Pack years: 22.00  . Smokeless tobacco: Never Used  Substance  and Sexual Activity  . Alcohol use: Yes    Comment: occ  . Drug use: No  . Sexual activity: Not on file

## 2017-09-24 ENCOUNTER — Ambulatory Visit (INDEPENDENT_AMBULATORY_CARE_PROVIDER_SITE_OTHER): Payer: Self-pay | Admitting: Orthopaedic Surgery

## 2017-09-24 ENCOUNTER — Encounter (INDEPENDENT_AMBULATORY_CARE_PROVIDER_SITE_OTHER): Payer: Self-pay | Admitting: Orthopaedic Surgery

## 2017-09-24 DIAGNOSIS — M545 Low back pain, unspecified: Secondary | ICD-10-CM | POA: Insufficient documentation

## 2017-09-24 MED ORDER — IBUPROFEN-FAMOTIDINE 800-26.6 MG PO TABS: ORAL_TABLET | ORAL | 6 refills | Status: DC

## 2017-09-24 NOTE — Progress Notes (Signed)
      Patient: Keith [name]           Date of Birth: [birth year]           MRN: [id number] Visit Date: 09/24/2017 PCP: Patient, No Pcp Per   Assessment & Plan:  Chief Complaint:  Chief Complaint  Patient presents with  . Lower Back - Pain, Follow-up   Visit Diagnoses:  1. Low back pain, unspecified back pain laterality, unspecified chronicity, with sciatica presence unspecified     Plan: Keith [name] comes in for follow-up from an epidural steroid injection by Dr. Alvester MorinNewton at S1 foramen.  This was on 09/14/2017.  He notes 20% relief of pain following the injection.  Still having marked pain radiating down to the left lower leg.  He is taking Aleve on a daily basis.  No bowel or bladder incontinence, no saddle paresthesias.  At this point, we will refer him back to Dr. Alvester MorinNewton for a second injection.  We will also go ahead and refer him to Dr. Conchita ParisNundkumar.  I am going to call and then a prescription of Duexis.  He has been instructed to stop taking his Aleve or any other anti-inflammatories.  He will follow-up with us on an as-needed basis.  The  Follow-Up Instructions: Return if symptoms worsen or fail to improve.   Orders:  Orders Placed This Encounter  Procedures  . Ambulatory referral to Neurosurgery  . Ambulatory referral to Physical Medicine Rehab   No orders of the defined types were placed in this encounter.   Imaging: No results found.  PMFS History: Patient Active Problem List   Diagnosis Date Noted  . Low back pain 09/24/2017  . Spinal stenosis, lumbar region, with neurogenic claudication 10/12/2013  . Herniated lumbar intervertebral disc 10/12/2013  . SINUSITIS, CHRONIC 11/30/2008   Past Medical History:  Diagnosis Date  . Arthritis   . Back pain with sciatica 10/04/13   left leg  . Heartburn     History reviewed. No pertinent family history.  Past Surgical History:  Procedure Laterality Date  . BACK SURGERY     lumbar x 2  . CHOLECYSTECTOMY    . FOOT SURGERY     . HEMI-MICRODISCECTOMY LUMBAR LAMINECTOMY LEVEL 1 Left 10/12/2013   Procedure: REDO HEMI-LAMINECTOMY MICRODISCECTOMY L5-S1 ON LEFT ;  Surgeon: Jacki Conesonald A Gioffre, MD;  Location: [facility];  Service: Orthopedics;  Laterality: Left;  . KNEE SURGERY     Social History   Occupational History  . Not on file  Tobacco Use  . Smoking status: Current Every Day Smoker    Packs/day: 1.00    Years: 22.00    Pack years: 22.00  . Smokeless tobacco: Never Used  Substance and Sexual Activity  . Alcohol use: Yes    Comment: occ  . Drug use: No  . Sexual activity: Not on file

## 2017-10-19 ENCOUNTER — Ambulatory Visit (INDEPENDENT_AMBULATORY_CARE_PROVIDER_SITE_OTHER): Payer: Self-pay | Admitting: Physical Medicine and Rehabilitation

## 2017-10-19 ENCOUNTER — Encounter (INDEPENDENT_AMBULATORY_CARE_PROVIDER_SITE_OTHER): Payer: Self-pay | Admitting: Physical Medicine and Rehabilitation

## 2017-10-19 ENCOUNTER — Ambulatory Visit (INDEPENDENT_AMBULATORY_CARE_PROVIDER_SITE_OTHER): Payer: Self-pay

## 2017-10-19 VITALS — BP 135/94 | HR 76 | Temp 97.6°F

## 2017-10-19 DIAGNOSIS — M5416 Radiculopathy, lumbar region: Secondary | ICD-10-CM

## 2017-10-19 MED ORDER — BETAMETHASONE SOD PHOS & ACET 6 (3-3) MG/ML IJ SUSP
12.0000 mg | Freq: Once | INTRAMUSCULAR | Status: AC
  Administered 2017-10-19: 12 mg

## 2017-10-19 NOTE — Progress Notes (Signed)
 .  Numeric Pain Rating Scale and Functional Assessment Average Pain 5   In the last MONTH (on 0-10 scale) has pain interfered with the following?  1. General activity like being  able to carry out your everyday physical activities such as walking, climbing stairs, carrying groceries, or moving a chair?  Rating(4)   +Driver, -BT, -Dye Allergies.  

## 2017-10-19 NOTE — Progress Notes (Signed)
SEATON HOFMANN - 42 y.o. male MRN [id number]  Date of birth: 06-Aug-1975  Office Visit Note: Visit Date: 10/19/2017 PCP: Patient, No Pcp Per Referred by: No ref. provider found  Subjective: Chief Complaint  Patient presents with  . Lower Back - Pain  . Left Leg - Pain   HPI: Mr. Harlon Flor is a 42 year old gentleman that we have completed 1 epidural injection which was a left S1 transforaminal epidural steroid injection.  He reports that overall his pain is better but is still persistent he still gets numbness and tingling in an S1 distribution and he still having a lot of left knee pain.  Dr. Roda Shutters have placed a referral for him to see a neurosurgeon Dr. Conchita Paris.  The patient says he has not heard from them.  I will send a message to Dr. Warren Danes assistant to see if they can get an idea of what is going on there.  The patient has had prior lumbar surgery by Dr. Darrelyn Hillock in the past.  We will repeat the S1 transforaminal injection today.   ROS Otherwise per HPI.  Assessment & Plan: Visit Diagnoses:  1. Lumbar radiculopathy     Plan: Findings:  Diagnostic and hopefully therapeutic left S1 transforaminal epidural steroid injection    Meds & Orders:  Meds ordered this encounter  Medications  . betamethasone acetate-betamethasone sodium phosphate (CELESTONE) injection 12 mg    Orders Placed This Encounter  Procedures  . XR C-ARM NO REPORT  . Epidural Steroid injection    Follow-up: Return if symptoms worsen or fail to improve, for Dr. Roda Shutters.   Procedures: No procedures performed  S1 Lumbosacral Transforaminal Epidural Steroid Injection - Sub-Pedicular Approach with Fluoroscopic Guidance   Patient: LIO WEHRLY      Date of Birth: [birth year] MRN: [id number] PCP: Patient, No Pcp Per      Visit Date: 10/19/2017   Universal Protocol:    Date/Time: 03/26/195:35 AM  Consent Given By: the patient  Position:  PRONE  Additional Comments: Vital signs were monitored before and after  the procedure. Patient was prepped and draped in the usual sterile fashion. The correct patient, procedure, and site was verified.   Injection Procedure Details:  Procedure Site One Meds Administered:  Meds ordered this encounter  Medications  . betamethasone acetate-betamethasone sodium phosphate (CELESTONE) injection 12 mg    Laterality: Left  Location/Site:  S1 Foramen   Needle size: 22 ga.  Needle type: Spinal  Needle Placement: Transforaminal  Findings:   -Comments: Excellent flow of contrast along the nerve and into the epidural space.  Procedure Details: After squaring off the sacral end-plate to get a true AP view, the C-arm was positioned so that the best possible view of the S1 foramen was visualized. The soft tissues overlying this structure were infiltrated with 2-3 ml. of 1% Lidocaine without Epinephrine.    The spinal needle was inserted toward the target using a "trajectory" view along the fluoroscope beam.  Under AP and lateral visualization, the needle was advanced so it did not puncture dura. Biplanar projections were used to confirm position. Aspiration was confirmed to be negative for CSF and/or blood. A 1-2 ml. volume of Isovue-250 was injected and flow of contrast was noted at each level. Radiographs were obtained for documentation purposes.   After attaining the desired flow of contrast documented above, a 0.5 to 1.0 ml test dose of 0.25% Marcaine was injected into each respective transforaminal space.  The patient was observed for 90  seconds post injection.  After no sensory deficits were reported, and normal lower extremity motor function was noted,   the above injectate was administered so that equal amounts of the injectate were placed at each foramen (level) into the transforaminal epidural space.   Additional Comments:  The patient tolerated the procedure well Dressing: Band-Aid    Post-procedure details: Patient was observed during the  procedure. Post-procedure instructions were reviewed.  Patient left the clinic in stable condition.   Clinical History: MRI LUMBAR SPINE WITHOUT CONTRAST  TECHNIQUE: Multiplanar, multisequence MR imaging of the lumbar spine was performed. No intravenous contrast was administered.  COMPARISON:  Radiography 08/18/2017.  MRI 05/29/2009.  FINDINGS: Segmentation:  5 lumbar type vertebral bodies.  Alignment:  Normal  Vertebrae:  No fracture or primary bone lesion.  Conus medullaris and cauda equina: Conus extends to the L1 level. Conus and cauda equina appear normal.  Paraspinal and other soft tissues: Negative  Disc levels:  No abnormality at L2-3 or above.  L3-4: No disc abnormality. Mild facet arthritis with small posterior synovial cysts. No canal encroachment.  L4-5: Previous laminectomy. Disc degeneration with annular fissures and annular bulging. This indents the thecal sac slightly. There is stenosis of both lateral recesses that could affect either L5 nerve, more likely the left. No compressive central canal stenosis due to the decompression. No foraminal extension.  L5-S1: Left hemilaminectomy. Disc degeneration with residual or recurrent left posterolateral herniation that could affect the left S1 nerve. No central canal stenosis. No foraminal encroachment. Findings are slightly worsened compared to the study of 2010.  IMPRESSION: L4-5: Interval wide laminectomy. Disc degeneration with annular fissures and annular bulging. Stenosis of the lateral recesses left more than right. Left L5 nerve compression could occur. Central canal sufficiently decompressed because of the laminectomy.  L5-S1: Distant left hemilaminectomy. Disc degeneration with recurrent left posterolateral herniation that could affect the left S1 nerve. This is slightly more prominent than was seen on the study of 2010.   Electronically Signed   By: Paulina FusiMark  Shogry M.D.   On:  08/23/2017 09:24   He reports that he has been smoking.  He has a 22.00 pack-year smoking history. He has never used smokeless tobacco. No results for input(s): HGBA1C, LABURIC in the last 8760 hours.  Objective:  VS:  HT:    WT:   BMI:     BP:(!) 135/94  HR:76bpm  TEMP:97.6 F (36.4 C)(Oral)  RESP:97 % Physical Exam  Musculoskeletal:  Patient has good strength with dorsiflexion and plantar flexion bilaterally    Ortho Exam Imaging: Xr C-arm No Report  Result Date: 10/19/2017 Please see Notes or Procedures tab for imaging impression.   Past Medical/Family/Surgical/Social History: Medications & Allergies reviewed per EMR, new medications updated. Patient Active Problem List   Diagnosis Date Noted  . Low back pain 09/24/2017  . Spinal stenosis, lumbar region, with neurogenic claudication 10/12/2013  . Herniated lumbar intervertebral disc 10/12/2013  . SINUSITIS, CHRONIC 11/30/2008   Past Medical History:  Diagnosis Date  . Arthritis   . Back pain with sciatica 10/04/13   left leg  . Heartburn    History reviewed. No pertinent family history. Past Surgical History:  Procedure Laterality Date  . BACK SURGERY     lumbar x 2  . CHOLECYSTECTOMY    . FOOT SURGERY    . HEMI-MICRODISCECTOMY LUMBAR LAMINECTOMY LEVEL 1 Left 10/12/2013   Procedure: REDO HEMI-LAMINECTOMY MICRODISCECTOMY L5-S1 ON LEFT ;  Surgeon: Jacki Conesonald A Gioffre, MD;  Location:  [facility];  Service: Orthopedics;  Laterality: Left;  . KNEE SURGERY     Social History   Occupational History  . Not on file  Tobacco Use  . Smoking status: Current Every Day Smoker    Packs/day: 1.00    Years: 22.00    Pack years: 22.00  . Smokeless tobacco: Never Used  Substance and Sexual Activity  . Alcohol use: Yes    Comment: occ  . Drug use: No  . Sexual activity: Not on file

## 2017-10-19 NOTE — Patient Instructions (Signed)

## 2017-10-20 NOTE — Procedures (Signed)
S1 Lumbosacral Transforaminal Epidural Steroid Injection - Sub-Pedicular Approach with Fluoroscopic Guidance   Patient: Keith AndreasJeffrey W Vereen      Date of Birth: 1976-03-11 MRN: [id number] PCP: Patient, No Pcp Per      Visit Date: 10/19/2017   Universal Protocol:    Date/Time: 03/26/195:35 AM  Consent Given By: the patient  Position:  PRONE  Additional Comments: Vital signs were monitored before and after the procedure. Patient was prepped and draped in the usual sterile fashion. The correct patient, procedure, and site was verified.   Injection Procedure Details:  Procedure Site One Meds Administered:  Meds ordered this encounter  Medications  . betamethasone acetate-betamethasone sodium phosphate (CELESTONE) injection 12 mg    Laterality: Left  Location/Site:  S1 Foramen   Needle size: 22 ga.  Needle type: Spinal  Needle Placement: Transforaminal  Findings:   -Comments: Excellent flow of contrast along the nerve and into the epidural space.  Procedure Details: After squaring off the sacral end-plate to get a true AP view, the C-arm was positioned so that the best possible view of the S1 foramen was visualized. The soft tissues overlying this structure were infiltrated with 2-3 ml. of 1% Lidocaine without Epinephrine.    The spinal needle was inserted toward the target using a "trajectory" view along the fluoroscope beam.  Under AP and lateral visualization, the needle was advanced so it did not puncture dura. Biplanar projections were used to confirm position. Aspiration was confirmed to be negative for CSF and/or blood. A 1-2 ml. volume of Isovue-250 was injected and flow of contrast was noted at each level. Radiographs were obtained for documentation purposes.   After attaining the desired flow of contrast documented above, a 0.5 to 1.0 ml test dose of 0.25% Marcaine was injected into each respective transforaminal space.  The patient was observed for 90 seconds post  injection.  After no sensory deficits were reported, and normal lower extremity motor function was noted,   the above injectate was administered so that equal amounts of the injectate were placed at each foramen (level) into the transforaminal epidural space.   Additional Comments:  The patient tolerated the procedure well Dressing: Band-Aid    Post-procedure details: Patient was observed during the procedure. Post-procedure instructions were reviewed.  Patient left the clinic in stable condition.

## 2018-04-12 DIAGNOSIS — R109 Unspecified abdominal pain: Secondary | ICD-10-CM | POA: Diagnosis not present

## 2018-05-31 ENCOUNTER — Telehealth (INDEPENDENT_AMBULATORY_CARE_PROVIDER_SITE_OTHER): Payer: Self-pay | Admitting: Physical Medicine and Rehabilitation

## 2018-05-31 DIAGNOSIS — M545 Low back pain: Secondary | ICD-10-CM | POA: Diagnosis not present

## 2018-05-31 DIAGNOSIS — E669 Obesity, unspecified: Secondary | ICD-10-CM | POA: Diagnosis not present

## 2018-05-31 NOTE — Telephone Encounter (Signed)
Yes if helped etc 

## 2018-05-31 NOTE — Telephone Encounter (Signed)
Left message for patient to call back to schedule. Patient was self-pay for previous, so will need to verify this vs insurance.

## 2018-06-04 ENCOUNTER — Encounter (INDEPENDENT_AMBULATORY_CARE_PROVIDER_SITE_OTHER): Payer: Self-pay | Admitting: Physician Assistant

## 2018-06-04 ENCOUNTER — Ambulatory Visit (INDEPENDENT_AMBULATORY_CARE_PROVIDER_SITE_OTHER): Payer: BLUE CROSS/BLUE SHIELD | Admitting: Orthopaedic Surgery

## 2018-06-04 ENCOUNTER — Other Ambulatory Visit (INDEPENDENT_AMBULATORY_CARE_PROVIDER_SITE_OTHER): Payer: Self-pay

## 2018-06-04 DIAGNOSIS — G8929 Other chronic pain: Secondary | ICD-10-CM | POA: Diagnosis not present

## 2018-06-04 DIAGNOSIS — M5440 Lumbago with sciatica, unspecified side: Secondary | ICD-10-CM

## 2018-06-04 DIAGNOSIS — M533 Sacrococcygeal disorders, not elsewhere classified: Secondary | ICD-10-CM

## 2018-06-04 NOTE — Progress Notes (Signed)
Office Visit Note   Patient: Keith [name]           Date of Birth: [birth year]           MRN: [id number] Visit Date: 06/04/2018              Requested by: No referring provider defined for this encounter. PCP: Patient, No Pcp Per   Assessment & Plan: Visit Diagnoses:  1. Chronic left SI joint pain     Plan: Impression is left SI joint pain and lumbar radiculopathy.  We will refer the patient back to Dr. Alvester Morin for a repeat epidural steroid injection.  We will also send him to formal physical therapy.  He will follow-up with [place] as needed. Follow-Up Instructions: Return if symptoms worsen or fail to improve.   Orders:  No orders of the defined types were placed in this encounter.  No orders of the defined types were placed in this encounter.     Procedures: No procedures performed   Clinical Data: No additional findings.   Subjective: Chief Complaint  Patient presents with  . 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  History of multiple lumbar surgeries by Dr. Darrelyn Hillock several years back.  Most recently, status post left lumbosacral transforaminal epidural steroid injection by Dr. Alvester Morin x2 from the spring of this past year.  Doing fairly well following the second injection in March 2019.  His pain did start to return and he was referred to Dr. Conchita Paris.  He was told based on his multiple previous back surgeries, that surgical intervention would be difficult with a long recovery and he should try and avoid this if at all possible.  He was told to continue to do back stretches.  He comes in today with a flareup of pain.  The pain is worse in his left buttocks and does radiate down his left lower extremity.  He has been on a prednisone taper by his primary care provider over the past several days which does seem to mildly help his  pain.  He does note increased weakness to the left lower extremity with associated numbness and tingling down the left leg.  No bowel or bladder change and no saddle paresthesias.  Review of Systems as detailed in HPI.  All others reviewed and are negative.   Objective: Vital Signs: There were no vitals taken for this visit.  Physical Exam well-developed well-nourished gentleman no acute distress.  Alert and oriented x3.  Ortho Exam stable lumbar exam  Specialty Comments:  No specialty comments available.  Imaging: No new imaging   PMFS History: Patient Active Problem List   Diagnosis Date Noted  . 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 Low back pain 09/24/2017  . Spinal stenosis, lumbar region, with neurogenic claudication 10/12/2013  . Herniated lumbar intervertebral disc 10/12/2013  . SINUSITIS, CHRONIC 11/30/2008   Past Medical History:  Diagnosis Date  . Arthritis   . Back pain with sciatica 10/04/13   left leg  . Heartburn     History reviewed. No pertinent family history.  Past Surgical History:  Procedure Laterality Date  . BACK SURGERY     lumbar x 2  . CHOLECYSTECTOMY    . FOOT SURGERY    . HEMI-MICRODISCECTOMY LUMBAR LAMINECTOMY LEVEL 1 Left 10/12/2013   Procedure: REDO HEMI-LAMINECTOMY MICRODISCECTOMY L5-S1 ON  LEFT ;  Surgeon: Jacki Cones, MD;  Location: [facility];  Service: Orthopedics;  Laterality: Left;  . KNEE SURGERY     Social History   Occupational History  . Not on file  Tobacco Use  . Smoking status: Current Every Day Smoker    Packs/day: 1.00    Years: 22.00    Pack years: 22.00  . Smokeless tobacco: Never Used  Substance and Sexual Activity  . Alcohol use: Yes    Comment: occ  . Drug use: No  . Sexual activity: Not on file

## 2018-06-08 ENCOUNTER — Telehealth (INDEPENDENT_AMBULATORY_CARE_PROVIDER_SITE_OTHER): Payer: Self-pay | Admitting: Orthopaedic Surgery

## 2018-06-08 NOTE — Telephone Encounter (Signed)
Medication refill  Prednisone  Robaxin       Doctors note for work  Patient came into the office will need a doctors note for work written out until the 23rd of November.

## 2018-06-09 ENCOUNTER — Other Ambulatory Visit (INDEPENDENT_AMBULATORY_CARE_PROVIDER_SITE_OTHER): Payer: Self-pay

## 2018-06-09 ENCOUNTER — Telehealth (INDEPENDENT_AMBULATORY_CARE_PROVIDER_SITE_OTHER): Payer: Self-pay | Admitting: Orthopaedic Surgery

## 2018-06-09 ENCOUNTER — Encounter (INDEPENDENT_AMBULATORY_CARE_PROVIDER_SITE_OTHER): Payer: Self-pay

## 2018-06-09 MED ORDER — PREDNISONE 10 MG (21) PO TBPK: ORAL_TABLET | ORAL | 0 refills | Status: DC

## 2018-06-09 MED ORDER — METHOCARBAMOL 750 MG PO TABS: 750.0000 mg | ORAL_TABLET | Freq: Three times a day (TID) | ORAL | 0 refills | Status: DC | PRN

## 2018-06-09 NOTE — Telephone Encounter (Signed)
Please advise. See message below.  

## 2018-06-09 NOTE — Telephone Encounter (Signed)
Called patient he was unavailable left message with mother. Patient will call us.     Work note ready for pick up here in our office. Both Rx sent into pharm.

## 2018-06-09 NOTE — Telephone Encounter (Signed)
[facility]  Please call patient back.  [phone]

## 2018-06-09 NOTE — Telephone Encounter (Signed)
yes

## 2018-06-10 NOTE — Telephone Encounter (Signed)
CALLED TO ADVISE   

## 2018-06-10 NOTE — Telephone Encounter (Signed)
SEE PREVIOUS MSG.

## 2018-06-14 DIAGNOSIS — M5442 Lumbago with sciatica, left side: Secondary | ICD-10-CM | POA: Diagnosis not present

## 2018-06-14 DIAGNOSIS — M5417 Radiculopathy, lumbosacral region: Secondary | ICD-10-CM | POA: Diagnosis not present

## 2018-06-14 DIAGNOSIS — R293 Abnormal posture: Secondary | ICD-10-CM | POA: Diagnosis not present

## 2018-06-14 DIAGNOSIS — M6281 Muscle weakness (generalized): Secondary | ICD-10-CM | POA: Diagnosis not present

## 2018-06-17 DIAGNOSIS — M6281 Muscle weakness (generalized): Secondary | ICD-10-CM | POA: Diagnosis not present

## 2018-06-17 DIAGNOSIS — M5442 Lumbago with sciatica, left side: Secondary | ICD-10-CM | POA: Diagnosis not present

## 2018-06-17 DIAGNOSIS — M5417 Radiculopathy, lumbosacral region: Secondary | ICD-10-CM | POA: Diagnosis not present

## 2018-06-17 DIAGNOSIS — R293 Abnormal posture: Secondary | ICD-10-CM | POA: Diagnosis not present

## 2018-06-18 ENCOUNTER — Encounter (INDEPENDENT_AMBULATORY_CARE_PROVIDER_SITE_OTHER): Payer: Self-pay

## 2018-06-18 ENCOUNTER — Telehealth (INDEPENDENT_AMBULATORY_CARE_PROVIDER_SITE_OTHER): Payer: Self-pay | Admitting: *Deleted

## 2018-06-18 ENCOUNTER — Encounter (INDEPENDENT_AMBULATORY_CARE_PROVIDER_SITE_OTHER): Payer: Self-pay | Admitting: Physical Medicine and Rehabilitation

## 2018-06-18 ENCOUNTER — Ambulatory Visit (INDEPENDENT_AMBULATORY_CARE_PROVIDER_SITE_OTHER): Payer: Self-pay

## 2018-06-18 ENCOUNTER — Ambulatory Visit (INDEPENDENT_AMBULATORY_CARE_PROVIDER_SITE_OTHER): Payer: BLUE CROSS/BLUE SHIELD | Admitting: Physical Medicine and Rehabilitation

## 2018-06-18 VITALS — BP 149/97 | HR 95 | Temp 98.2°F

## 2018-06-18 DIAGNOSIS — M5416 Radiculopathy, lumbar region: Secondary | ICD-10-CM

## 2018-06-18 DIAGNOSIS — M961 Postlaminectomy syndrome, not elsewhere classified: Secondary | ICD-10-CM

## 2018-06-18 MED ORDER — BETAMETHASONE SOD PHOS & ACET 6 (3-3) MG/ML IJ SUSP
12.0000 mg | Freq: Once | INTRAMUSCULAR | Status: AC
  Administered 2018-06-18: 12 mg

## 2018-06-18 NOTE — Progress Notes (Signed)
 .  Numeric Pain Rating Scale and Functional Assessment Average Pain 8   In the last MONTH (on 0-10 scale) has pain interfered with the following?  1. General activity like being  able to carry out your everyday physical activities such as walking, climbing stairs, carrying groceries, or moving a chair?  Rating(8)   +Driver, -BT, -Dye Allergies.  

## 2018-06-18 NOTE — Telephone Encounter (Signed)
See message below °

## 2018-06-18 NOTE — Telephone Encounter (Signed)
Ok to do

## 2018-06-18 NOTE — Patient Instructions (Signed)

## 2018-06-18 NOTE — Telephone Encounter (Signed)
Note  Made. Ready for pick up. Patient aware.

## 2018-06-21 DIAGNOSIS — M5417 Radiculopathy, lumbosacral region: Secondary | ICD-10-CM | POA: Diagnosis not present

## 2018-06-21 DIAGNOSIS — R293 Abnormal posture: Secondary | ICD-10-CM | POA: Diagnosis not present

## 2018-06-21 DIAGNOSIS — M5442 Lumbago with sciatica, left side: Secondary | ICD-10-CM | POA: Diagnosis not present

## 2018-06-21 DIAGNOSIS — M6281 Muscle weakness (generalized): Secondary | ICD-10-CM | POA: Diagnosis not present

## 2018-06-29 ENCOUNTER — Telehealth (INDEPENDENT_AMBULATORY_CARE_PROVIDER_SITE_OTHER): Payer: Self-pay | Admitting: Physical Medicine and Rehabilitation

## 2018-06-29 NOTE — Telephone Encounter (Signed)
Ok to try one more but injection looked perfect so would not repeat if not much hep after this, might need to regroup PT or new imaging if not helpful- vs spine surgery consult

## 2018-06-29 NOTE — Telephone Encounter (Signed)
Scheduled for 12/17 at 1530 with driver.

## 2018-07-13 ENCOUNTER — Ambulatory Visit (INDEPENDENT_AMBULATORY_CARE_PROVIDER_SITE_OTHER): Payer: Self-pay

## 2018-07-13 ENCOUNTER — Encounter (INDEPENDENT_AMBULATORY_CARE_PROVIDER_SITE_OTHER): Payer: Self-pay | Admitting: Physical Medicine and Rehabilitation

## 2018-07-13 ENCOUNTER — Ambulatory Visit (INDEPENDENT_AMBULATORY_CARE_PROVIDER_SITE_OTHER): Payer: BLUE CROSS/BLUE SHIELD | Admitting: Physical Medicine and Rehabilitation

## 2018-07-13 VITALS — BP 141/79 | HR 93 | Temp 97.5°F

## 2018-07-13 DIAGNOSIS — M5416 Radiculopathy, lumbar region: Secondary | ICD-10-CM | POA: Diagnosis not present

## 2018-07-13 DIAGNOSIS — M5116 Intervertebral disc disorders with radiculopathy, lumbar region: Secondary | ICD-10-CM | POA: Diagnosis not present

## 2018-07-13 MED ORDER — BETAMETHASONE SOD PHOS & ACET 6 (3-3) MG/ML IJ SUSP
12.0000 mg | Freq: Once | INTRAMUSCULAR | Status: AC
  Administered 2018-07-13: 12 mg

## 2018-07-13 NOTE — Progress Notes (Signed)
 .  Numeric Pain Rating Scale and Functional Assessment Average Pain 6   In the last MONTH (on 0-10 scale) has pain interfered with the following?  1. General activity like being  able to carry out your everyday physical activities such as walking, climbing stairs, carrying groceries, or moving a chair?  Rating(4)   +Driver, -BT, -Dye Allergies.  

## 2018-07-13 NOTE — Patient Instructions (Signed)

## 2018-07-15 NOTE — Progress Notes (Signed)
Keith AndreasJeffrey W Mahler - 42 y.o. male MRN [id number]  Date of birth: 24-Nov-1975  Office Visit Note: Visit Date: 06/18/2018 PCP: Patient, No Pcp Per Referred by: No ref. provider found  Subjective: Chief Complaint  Patient presents with  . Left Leg - Pain, Tingling  . Left Foot - Pain   HPI:  Keith [name] is a 42 y.o. male who comes in today For planned left S1 transforaminal epidural steroid injection.  Patient's had prior lumbar laminectomy and discectomy with recurrent disc herniation at L5-S1.  He has had findings that could contribute to either L5 or S1 radiculitis radiculopathy.  His symptoms have been classic S1 distribution.  Injection in March gave him quite a bit of relief up until just recently.  Seems to have gotten over 6 months or more of relief of his leg pain.  He was pretty happy overall.  Symptoms recurred over the last month without new injury or trauma.  We will repeat this today and see how he does.  ROS Otherwise per HPI.  Assessment & Plan: Visit Diagnoses:  1. Lumbar radiculopathy   2. Post laminectomy syndrome     Plan: No additional findings.   Meds & Orders:  Meds ordered this encounter  Medications  . betamethasone acetate-betamethasone sodium phosphate (CELESTONE) injection 12 mg    Orders Placed This Encounter  Procedures  . XR C-ARM NO REPORT  . Epidural Steroid injection    Follow-up: Return if symptoms worsen or fail to improve.   Procedures: No procedures performed  S1 Lumbosacral Transforaminal Epidural Steroid Injection - Sub-Pedicular Approach with Fluoroscopic Guidance   Patient: Keith [name]      Date of Birth: 24-Nov-1975 MRN: [id number] PCP: Patient, No Pcp Per      Visit Date: 06/18/2018   Universal Protocol:    Date/Time: 12/19/196:19 AM  Consent Given By: the patient  Position:  PRONE  Additional Comments: Vital signs were monitored before and after the procedure. Patient was prepped and draped in the usual sterile  fashion. The correct patient, procedure, and site was verified.   Injection Procedure Details:  Procedure Site One Meds Administered:  Meds ordered this encounter  Medications  . betamethasone acetate-betamethasone sodium phosphate (CELESTONE) injection 12 mg    Laterality: Left  Location/Site:  S1 Foramen   Needle size: 22 ga.  Needle type: Spinal  Needle Placement: Transforaminal  Findings:   -Comments: Excellent flow of contrast along the nerve and into the epidural space.  Procedure Details: After squaring off the sacral end-plate to get a true AP view, the C-arm was positioned so that the best possible view of the S1 foramen was visualized. The soft tissues overlying this structure were infiltrated with 2-3 ml. of 1% Lidocaine without Epinephrine.    The spinal needle was inserted toward the target using a "trajectory" view along the fluoroscope beam.  Under AP and lateral visualization, the needle was advanced so it did not puncture dura. Biplanar projections were used to confirm position. Aspiration was confirmed to be negative for CSF and/or blood. A 1-2 ml. volume of Isovue-250 was injected and flow of contrast was noted at each level. Radiographs were obtained for documentation purposes.   After attaining the desired flow of contrast documented above, a 0.5 to 1.0 ml test dose of 0.25% Marcaine was injected into each respective transforaminal space.  The patient was observed for 90 seconds post injection.  After no sensory deficits were reported, and normal lower extremity motor function  was noted,   the above injectate was administered so that equal amounts of the injectate were placed at each foramen (level) into the transforaminal epidural space.   Additional Comments:  The patient tolerated the procedure well Dressing: Band-Aid    Post-procedure details: Patient was observed during the procedure. Post-procedure instructions were reviewed.  Patient left the  clinic in stable condition.    Clinical History: MRI LUMBAR SPINE WITHOUT CONTRAST  TECHNIQUE: Multiplanar, multisequence MR imaging of the lumbar spine was performed. No intravenous contrast was administered.  COMPARISON:  Radiography 08/18/2017.  MRI 05/29/2009.  FINDINGS: Segmentation:  5 lumbar type vertebral bodies.  Alignment:  Normal  Vertebrae:  No fracture or primary bone lesion.  Conus medullaris and cauda equina: Conus extends to the L1 level. Conus and cauda equina appear normal.  Paraspinal and other soft tissues: Negative  Disc levels:  No abnormality at L2-3 or above.  L3-4: No disc abnormality. Mild facet arthritis with small posterior synovial cysts. No canal encroachment.  L4-5: Previous laminectomy. Disc degeneration with annular fissures and annular bulging. This indents the thecal sac slightly. There is stenosis of both lateral recesses that could affect either L5 nerve, more likely the left. No compressive central canal stenosis due to the decompression. No foraminal extension.  L5-S1: Left hemilaminectomy. Disc degeneration with residual or recurrent left posterolateral herniation that could affect the left S1 nerve. No central canal stenosis. No foraminal encroachment. Findings are slightly worsened compared to the study of 2010.  IMPRESSION: L4-5: Interval wide laminectomy. Disc degeneration with annular fissures and annular bulging. Stenosis of the lateral recesses left more than right. Left L5 nerve compression could occur. Central canal sufficiently decompressed because of the laminectomy.  L5-S1: Distant left hemilaminectomy. Disc degeneration with recurrent left posterolateral herniation that could affect the left S1 nerve. This is slightly more prominent than was seen on the study of 2010.   Electronically Signed   By: Paulina FusiMark  Shogry M.D.   On: 08/23/2017 09:24     Objective:  VS:  HT:    WT:   BMI:     BP:(!)  149/97  HR:95bpm  TEMP:98.2 F (36.8 C)(Oral)  RESP:  Physical Exam  Ortho Exam Imaging: No results found.

## 2018-07-15 NOTE — Progress Notes (Signed)
Keith AndreasJeffrey W Bulthuis - 42 y.o. male MRN [id number]  Date of birth: October 02, 1975  Office Visit Note: Visit Date: 07/13/2018 PCP: Patient, No Pcp Per Referred by: No ref. provider found  Subjective: Chief Complaint  Patient presents with  . Left Leg - Pain   HPI:  Keith [name] is a 42 y.o. male who comes in today For planned left S1 transforaminal injection versus L5 transforaminal epidural steroid injection.  Patient's had prior lumbar discectomy with recurrent disc herniation.  Has findings on MRI that could affect either the L5 or S1 nerve root on the left.  Prior S1 injections were very beneficial initially.  Last injection performed in November seem to help with his back but not his radicular leg pain with paresthesia.  His symptoms are classic S1 distribution down the posterior leg to the bottom of the foot and side of the foot.  He has no right-sided complaints no new trauma.  We will repeat the injection one time and see how he does if is not helping his leg pain at that point I think he needs updated MRI once again and then likely spine surgery referral.  No focal weakness on exam.  ROS Otherwise per HPI.  Assessment & Plan: Visit Diagnoses:  1. Lumbar radiculopathy   2. Radiculopathy due to lumbar intervertebral disc disorder     Plan: No additional findings.   Meds & Orders:  Meds ordered this encounter  Medications  . betamethasone acetate-betamethasone sodium phosphate (CELESTONE) injection 12 mg    Orders Placed This Encounter  Procedures  . XR C-ARM NO REPORT  . Epidural Steroid injection    Follow-up: No follow-ups on file.   Procedures: No procedures performed  S1 Lumbosacral Transforaminal Epidural Steroid Injection - Sub-Pedicular Approach with Fluoroscopic Guidance   Patient: Keith [name]      Date of Birth: October 02, 1975 MRN: [id number] PCP: Patient, No Pcp Per      Visit Date: 07/13/2018   Universal Protocol:    Date/Time: 12/19/196:17 AM  Consent  Given By: the patient  Position:  PRONE  Additional Comments: Vital signs were monitored before and after the procedure. Patient was prepped and draped in the usual sterile fashion. The correct patient, procedure, and site was verified.   Injection Procedure Details:  Procedure Site One Meds Administered:  Meds ordered this encounter  Medications  . betamethasone acetate-betamethasone sodium phosphate (CELESTONE) injection 12 mg    Laterality: Left  Location/Site:  S1 Foramen   Needle size: 22 ga.  Needle type: Spinal  Needle Placement: Transforaminal  Findings:   -Comments: Excellent flow of contrast along the nerve and into the epidural space.  Procedure Details: After squaring off the sacral end-plate to get a true AP view, the C-arm was positioned so that the best possible view of the S1 foramen was visualized. The soft tissues overlying this structure were infiltrated with 2-3 ml. of 1% Lidocaine without Epinephrine.    The spinal needle was inserted toward the target using a "trajectory" view along the fluoroscope beam.  Under AP and lateral visualization, the needle was advanced so it did not puncture dura. Biplanar projections were used to confirm position. Aspiration was confirmed to be negative for CSF and/or blood. A 1-2 ml. volume of Isovue-250 was injected and flow of contrast was noted at each level. Radiographs were obtained for documentation purposes.   After attaining the desired flow of contrast documented above, a 0.5 to 1.0 ml test dose of 0.25% Marcaine  was injected into each respective transforaminal space.  The patient was observed for 90 seconds post injection.  After no sensory deficits were reported, and normal lower extremity motor function was noted,   the above injectate was administered so that equal amounts of the injectate were placed at each foramen (level) into the transforaminal epidural space.   Additional Comments:  The patient tolerated  the procedure well Dressing: Band-Aid    Post-procedure details: Patient was observed during the procedure. Post-procedure instructions were reviewed.  Patient left the clinic in stable condition.   Clinical History: MRI LUMBAR SPINE WITHOUT CONTRAST  TECHNIQUE: Multiplanar, multisequence MR imaging of the lumbar spine was performed. No intravenous contrast was administered.  COMPARISON:  Radiography 08/18/2017.  MRI 05/29/2009.  FINDINGS: Segmentation:  5 lumbar type vertebral bodies.  Alignment:  Normal  Vertebrae:  No fracture or primary bone lesion.  Conus medullaris and cauda equina: Conus extends to the L1 level. Conus and cauda equina appear normal.  Paraspinal and other soft tissues: Negative  Disc levels:  No abnormality at L2-3 or above.  L3-4: No disc abnormality. Mild facet arthritis with small posterior synovial cysts. No canal encroachment.  L4-5: Previous laminectomy. Disc degeneration with annular fissures and annular bulging. This indents the thecal sac slightly. There is stenosis of both lateral recesses that could affect either L5 nerve, more likely the left. No compressive central canal stenosis due to the decompression. No foraminal extension.  L5-S1: Left hemilaminectomy. Disc degeneration with residual or recurrent left posterolateral herniation that could affect the left S1 nerve. No central canal stenosis. No foraminal encroachment. Findings are slightly worsened compared to the study of 2010.  IMPRESSION: L4-5: Interval wide laminectomy. Disc degeneration with annular fissures and annular bulging. Stenosis of the lateral recesses left more than right. Left L5 nerve compression could occur. Central canal sufficiently decompressed because of the laminectomy.  L5-S1: Distant left hemilaminectomy. Disc degeneration with recurrent left posterolateral herniation that could affect the left S1 nerve. This is slightly more  prominent than was seen on the study of 2010.   Electronically Signed   By: Paulina FusiMark  Shogry M.D.   On: 08/23/2017 09:24     Objective:  VS:  HT:    WT:   BMI:     BP:(!) 141/79  HR:93bpm  TEMP:(!) 97.5 F (36.4 C)(Oral)  RESP:  Physical Exam  Ortho Exam Imaging: No results found.

## 2018-07-15 NOTE — Procedures (Signed)
S1 Lumbosacral Transforaminal Epidural Steroid Injection - Sub-Pedicular Approach with Fluoroscopic Guidance   Patient: Keith [name]      Date of Birth: 1976-07-02 MRN: [id number] PCP: Patient, No Pcp Per      Visit Date: 07/13/2018   Universal Protocol:    Date/Time: 12/19/196:17 AM  Consent Given By: the patient  Position:  PRONE  Additional Comments: Vital signs were monitored before and after the procedure. Patient was prepped and draped in the usual sterile fashion. The correct patient, procedure, and site was verified.   Injection Procedure Details:  Procedure Site One Meds Administered:  Meds ordered this encounter  Medications  . betamethasone acetate-betamethasone sodium phosphate (CELESTONE) injection 12 mg    Laterality: Left  Location/Site:  S1 Foramen   Needle size: 22 ga.  Needle type: Spinal  Needle Placement: Transforaminal  Findings:   -Comments: Excellent flow of contrast along the nerve and into the epidural space.  Procedure Details: After squaring off the sacral end-plate to get a true AP view, the C-arm was positioned so that the best possible view of the S1 foramen was visualized. The soft tissues overlying this structure were infiltrated with 2-3 ml. of 1% Lidocaine without Epinephrine.    The spinal needle was inserted toward the target using a "trajectory" view along the fluoroscope beam.  Under AP and lateral visualization, the needle was advanced so it did not puncture dura. Biplanar projections were used to confirm position. Aspiration was confirmed to be negative for CSF and/or blood. A 1-2 ml. volume of Isovue-250 was injected and flow of contrast was noted at each level. Radiographs were obtained for documentation purposes.   After attaining the desired flow of contrast documented above, a 0.5 to 1.0 ml test dose of 0.25% Marcaine was injected into each respective transforaminal space.  The patient was observed for 90 seconds post  injection.  After no sensory deficits were reported, and normal lower extremity motor function was noted,   the above injectate was administered so that equal amounts of the injectate were placed at each foramen (level) into the transforaminal epidural space.   Additional Comments:  The patient tolerated the procedure well Dressing: Band-Aid    Post-procedure details: Patient was observed during the procedure. Post-procedure instructions were reviewed.  Patient left the clinic in stable condition.

## 2018-07-15 NOTE — Procedures (Signed)
S1 Lumbosacral Transforaminal Epidural Steroid Injection - Sub-Pedicular Approach with Fluoroscopic Guidance   Patient: Keith [name]      Date of Birth: 25-Feb-1976 MRN: [id number] PCP: Patient, No Pcp Per      Visit Date: 06/18/2018   Universal Protocol:    Date/Time: 12/19/196:19 AM  Consent Given By: the patient  Position:  PRONE  Additional Comments: Vital signs were monitored before and after the procedure. Patient was prepped and draped in the usual sterile fashion. The correct patient, procedure, and site was verified.   Injection Procedure Details:  Procedure Site One Meds Administered:  Meds ordered this encounter  Medications  . betamethasone acetate-betamethasone sodium phosphate (CELESTONE) injection 12 mg    Laterality: Left  Location/Site:  S1 Foramen   Needle size: 22 ga.  Needle type: Spinal  Needle Placement: Transforaminal  Findings:   -Comments: Excellent flow of contrast along the nerve and into the epidural space.  Procedure Details: After squaring off the sacral end-plate to get a true AP view, the C-arm was positioned so that the best possible view of the S1 foramen was visualized. The soft tissues overlying this structure were infiltrated with 2-3 ml. of 1% Lidocaine without Epinephrine.    The spinal needle was inserted toward the target using a "trajectory" view along the fluoroscope beam.  Under AP and lateral visualization, the needle was advanced so it did not puncture dura. Biplanar projections were used to confirm position. Aspiration was confirmed to be negative for CSF and/or blood. A 1-2 ml. volume of Isovue-250 was injected and flow of contrast was noted at each level. Radiographs were obtained for documentation purposes.   After attaining the desired flow of contrast documented above, a 0.5 to 1.0 ml test dose of 0.25% Marcaine was injected into each respective transforaminal space.  The patient was observed for 90 seconds post  injection.  After no sensory deficits were reported, and normal lower extremity motor function was noted,   the above injectate was administered so that equal amounts of the injectate were placed at each foramen (level) into the transforaminal epidural space.   Additional Comments:  The patient tolerated the procedure well Dressing: Band-Aid    Post-procedure details: Patient was observed during the procedure. Post-procedure instructions were reviewed.  Patient left the clinic in stable condition.

## 2018-07-19 DIAGNOSIS — M5489 Other dorsalgia: Secondary | ICD-10-CM | POA: Diagnosis not present

## 2018-07-20 ENCOUNTER — Other Ambulatory Visit: Payer: Self-pay

## 2018-07-20 ENCOUNTER — Emergency Department (HOSPITAL_COMMUNITY)
Admission: EM | Admit: 2018-07-20 | Discharge: 2018-07-20 | Disposition: A | Discharge status: Home / Self Care | Payer: BLUE CROSS/BLUE SHIELD | Attending: Emergency Medicine | Admitting: Emergency Medicine

## 2018-07-20 ENCOUNTER — Encounter (HOSPITAL_COMMUNITY): Payer: Self-pay

## 2018-07-20 DIAGNOSIS — M5442 Lumbago with sciatica, left side: Secondary | ICD-10-CM | POA: Insufficient documentation

## 2018-07-20 DIAGNOSIS — M549 Dorsalgia, unspecified: Secondary | ICD-10-CM | POA: Diagnosis not present

## 2018-07-20 DIAGNOSIS — M5432 Sciatica, left side: Secondary | ICD-10-CM

## 2018-07-20 DIAGNOSIS — G8929 Other chronic pain: Secondary | ICD-10-CM | POA: Insufficient documentation

## 2018-07-20 DIAGNOSIS — M545 Low back pain, unspecified: Secondary | ICD-10-CM

## 2018-07-20 MED ORDER — METHOCARBAMOL 1000 MG/10ML IJ SOLN
1000.0000 mg | Freq: Once | INTRAVENOUS | Status: AC
  Administered 2018-07-20: 1000 mg via INTRAVENOUS
  Filled 2018-07-20: qty 10

## 2018-07-20 MED ORDER — MORPHINE SULFATE (PF) 4 MG/ML IV SOLN
4.0000 mg | Freq: Once | INTRAVENOUS | Status: AC
  Administered 2018-07-20: 4 mg via INTRAVENOUS
  Filled 2018-07-20: qty 1

## 2018-07-20 MED ORDER — KETOROLAC TROMETHAMINE 30 MG/ML IJ SOLN
30.0000 mg | Freq: Once | INTRAMUSCULAR | Status: AC
  Administered 2018-07-20: 30 mg via INTRAVENOUS
  Filled 2018-07-20: qty 1

## 2018-07-20 MED ORDER — OXYCODONE-ACETAMINOPHEN 5-325 MG PO TABS: 1.0000 | ORAL_TABLET | ORAL | 0 refills | Status: DC | PRN

## 2018-07-20 MED ORDER — ORPHENADRINE CITRATE ER 100 MG PO TB12: 100.0000 mg | ORAL_TABLET | Freq: Two times a day (BID) | ORAL | 0 refills | Status: DC

## 2018-07-20 MED ORDER — NAPROXEN 500 MG PO TABS: 500.0000 mg | ORAL_TABLET | Freq: Two times a day (BID) | ORAL | 0 refills | Status: DC

## 2018-07-20 NOTE — ED Notes (Signed)
[facility] Expected date:  Expected time:  Means of arrival:  Comments: [age] yr old back pain

## 2018-07-20 NOTE — ED Provider Notes (Signed)
[facility]-EMERGENCY DEPT Provider Note   CSN: [id number] Arrival date & time: 07/20/18  0018     History   Chief Complaint Chief Complaint  Patient presents with  . Back Pain    HPI Keith [name] is a 42 y.o. male.  The history is provided by the patient.  Back Pain    He has history of spinal stenosis and herniated disks and comes in complaining of pain in his left gluteal area radiating down to his left foot.  Pain started yesterday when he picked up a bucket.  Pain is severe and he rates it at 9/10.  He is unable to stand because of pain.  He denies any bowel or bladder dysfunction.  He has tingling going into his foot but no actual loss of sensation.  Of note, he had received a spinal epidural injection 1 week ago.  Past Medical History:  Diagnosis Date  . Arthritis   . Back pain with sciatica 10/04/13   left leg  . Heartburn     Patient Active Problem List   Diagnosis Date Noted  . 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 Low back pain 09/24/2017  . Spinal stenosis, lumbar region, with neurogenic claudication 10/12/2013  . Herniated lumbar intervertebral disc 10/12/2013  . SINUSITIS, CHRONIC 11/30/2008    Past Surgical History:  Procedure Laterality Date  . BACK SURGERY     lumbar x 2  . CHOLECYSTECTOMY    . FOOT SURGERY    . HEMI-MICRODISCECTOMY LUMBAR LAMINECTOMY LEVEL 1 Left 10/12/2013   Procedure: REDO HEMI-LAMINECTOMY MICRODISCECTOMY L5-S1 ON LEFT ;  Surgeon: Jacki Conesonald A Gioffre, MD;  Location: [facility];  Service: Orthopedics;  Laterality: Left;  . KNEE SURGERY          Home Medications    Prior to Admission medications   Medication Sig Start Date End Date Taking? Authorizing Provider  ibuprofen (ADVIL,MOTRIN) 200 MG tablet Take 600 mg by mouth every 6 (six) hours as needed for mild pain.   Yes [provider]  methocarbamol (ROBAXIN) 750 MG tablet Take 750 mg by mouth daily as needed for muscle spasms.   Yes [provider]  clotrimazole (LOTRIMIN) 1 % cream Apply to affected area 2 times daily until gone Patient not taking: Reported on 09/14/2017 05/11/17   Gwyneth SproutPlunkett, Whitney, MD  Ibuprofen-Famotidine (DUEXIS) 800-26.6 MG TABS Take as directed prn pain Patient not taking: Reported on 07/20/2018 09/24/17   Cristie HemStanbery, Mary L, PA-C    Family History History reviewed. No pertinent family history.  Social History Social History   Tobacco Use  . Smoking status: Current Every Day Smoker    Packs/day: 1.00    Years: 22.00    Pack years: 22.00  . Smokeless tobacco: Never Used  Substance Use Topics  . Alcohol use: Yes    Comment: occ  . Drug use: No     Allergies   Patient has no known allergies.   Review of Systems Review of Systems  Musculoskeletal: Positive for back pain.  All other systems reviewed and are negative.    Physical Exam Updated Vital Signs BP 137/81 (BP Location: Left Arm)   Pulse 74   Temp 98.2 F (36.8 C) (Oral)   SpO2 97%   Physical Exam Vitals signs and nursing note reviewed.    42 year old male, resting comfortably and in no acute distress. Vital signs are normal. Oxygen saturation is 97%, which is normal. Head is normocephalic and  atraumatic. PERRLA, EOMI. Oropharynx is clear. Neck is nontender and supple without adenopathy or JVD. Back is nontender and there is no CVA tenderness.  Straight leg raise is positive on the left at 10 degrees, crossed straight leg raise is positive on the right at 30degrees. Lungs are clear without rales, wheezes, or rhonchi. Chest is nontender. Heart has regular rate and rhythm without murmur. Abdomen is soft, flat, nontender without masses or hepatosplenomegaly and peristalsis is normoactive. Extremities have no cyanosis or edema, full range of motion is present. Skin is warm and dry without rash. Neurologic: Mental status is normal, cranial nerves are intact, there are no motor or sensory deficits.  ED Treatments /  Results   Procedures Procedures  Medications Ordered in ED Medications  morphine 4 MG/ML injection 4 mg (has no administration in time range)  methocarbamol (ROBAXIN) 1,000 mg in dextrose 5 % 50 mL IVPB (1,000 mg Intravenous New Bag/Given 07/20/18 0544)  ketorolac (TORADOL) 30 MG/ML injection 30 mg (30 mg Intravenous Given 07/20/18 0542)     Initial Impression / Assessment and Plan / ED Course  I have reviewed the triage vital signs and the nursing notes.  Pertinent imaging results that were available during my care of the patient were reviewed by me and considered in my medical decision making (see chart for details).  Left-sided sciatica.  Old records are reviewed confirming recent spinal epidural injection, and past laminectomies.  No evidence of neurologic injury today.  We will give IV methocarbamol and ketorolac and reassess.  Following above-noted treatment, there was some improvement.  He was able to ambulate, but with a significant limp.  He is given a dose of morphine and is discharged with prescriptions for naproxen, orphenadrine, oxycodone-acetaminophen.  He is referred back to his orthopedic physician for follow-up.  Return precautions discussed.  Final Clinical Impressions(s) / ED Diagnoses   Final diagnoses:  Acute exacerbation of chronic low back pain  Sciatica of left side    ED Discharge Orders         Ordered    naproxen (NAPROSYN) 500 MG tablet  2 times daily     07/20/18 0653    orphenadrine (NORFLEX) 100 MG tablet  2 times daily     07/20/18 0653    oxyCODONE-acetaminophen (PERCOCET) 5-325 MG tablet  Every 4 hours PRN     07/20/18 0653           Dione BoozeGlick, Nabilah Davoli, MD 07/20/18 [phone]

## 2018-07-20 NOTE — Discharge Instructions (Addendum)
Apply ice for thirty minutes, four times a day.  Return if symptoms are getting worse.

## 2018-07-20 NOTE — ED Triage Notes (Signed)
Coming from home ([place], he is a live-in) -C/C back pain, radiates down his left buttock to leg  -hx sciatica, bulging discs L4 L5  -at work 8 am, went to turn w/ bucket, heard a pop/felt a pop, no pain when still, pain w/ movement -been unable to get up since, laying in floor  -steroid shot x1 week ago, not a lot of relief  Vitals -BP 134/80 -CBG 118 -P 74 -RR 16 -O2 98% RA

## 2018-07-22 ENCOUNTER — Other Ambulatory Visit (INDEPENDENT_AMBULATORY_CARE_PROVIDER_SITE_OTHER): Payer: Self-pay

## 2018-07-22 ENCOUNTER — Telehealth (INDEPENDENT_AMBULATORY_CARE_PROVIDER_SITE_OTHER): Payer: Self-pay | Admitting: Orthopaedic Surgery

## 2018-07-22 DIAGNOSIS — M5440 Lumbago with sciatica, unspecified side: Secondary | ICD-10-CM

## 2018-07-22 NOTE — Telephone Encounter (Signed)
See message below °

## 2018-07-22 NOTE — Telephone Encounter (Signed)
[facility]

## 2018-07-22 NOTE — Telephone Encounter (Signed)
Yes [facility] to [facility]

## 2018-07-22 NOTE — Telephone Encounter (Signed)
Patient's mother calling because he has not found in relief with the injections from Dr. Alvester MorinNewton.  Patient was in so much pain that he was unable to sit, walk, or lay down.  [facility] and was given naproxen, norflex, and percocet.  He spent Christmas Day on the floor and outside on concrete driveway trying to find relief, even after the medication. She is asking if a referral was on order to a neurosurgeon or neurologist, and if so, could that be expedited. Please call and speak with mother about the plan of treatment.

## 2018-07-26 ENCOUNTER — Telehealth (INDEPENDENT_AMBULATORY_CARE_PROVIDER_SITE_OTHER): Payer: Self-pay

## 2018-07-26 NOTE — Telephone Encounter (Signed)
One time #20.

## 2018-07-26 NOTE — Telephone Encounter (Signed)
Patient would like a Rf on Percocet until he gets to see Dr Conchita ParisNundkumar. Appt pending.      CB [phone]

## 2018-07-27 ENCOUNTER — Other Ambulatory Visit (INDEPENDENT_AMBULATORY_CARE_PROVIDER_SITE_OTHER): Payer: Self-pay

## 2018-07-27 MED ORDER — OXYCODONE-ACETAMINOPHEN 5-325 MG PO TABS: 1.0000 | ORAL_TABLET | Freq: Four times a day (QID) | ORAL | 0 refills | Status: DC | PRN

## 2018-07-27 NOTE — Telephone Encounter (Signed)
Aware Rx ready at front desk 

## 2018-08-02 DIAGNOSIS — M21372 Foot drop, left foot: Secondary | ICD-10-CM | POA: Diagnosis not present

## 2018-08-03 DIAGNOSIS — M21372 Foot drop, left foot: Secondary | ICD-10-CM | POA: Diagnosis not present

## 2018-08-03 DIAGNOSIS — M5126 Other intervertebral disc displacement, lumbar region: Secondary | ICD-10-CM | POA: Diagnosis not present

## 2018-08-09 DIAGNOSIS — M5442 Lumbago with sciatica, left side: Secondary | ICD-10-CM | POA: Diagnosis not present

## 2018-08-09 DIAGNOSIS — Z6836 Body mass index (BMI) 36.0-36.9, adult: Secondary | ICD-10-CM | POA: Diagnosis not present

## 2018-08-09 DIAGNOSIS — M5126 Other intervertebral disc displacement, lumbar region: Secondary | ICD-10-CM | POA: Diagnosis not present

## 2018-08-09 DIAGNOSIS — R030 Elevated blood-pressure reading, without diagnosis of hypertension: Secondary | ICD-10-CM | POA: Diagnosis not present

## 2018-08-13 ENCOUNTER — Other Ambulatory Visit: Payer: Self-pay | Admitting: Neurosurgery

## 2018-08-23 NOTE — Pre-Procedure Instructions (Signed)
Keith AndreasJeffrey W [name]  08/23/2018      [facility] - [place] ([place]), [place] - [address] [place] [address] Phone: [phone] Fax: [phone]    Your procedure is scheduled on February, 4, 2020.  [facility] AM.  Call this number if you have problems the morning of surgery:  [phone]   Remember:  Do not eat or drink after midnight.    Take these medicines the morning of surgery with A SIP OF WATER  Medrol Dosepak (if still taking  7 days prior to surgery STOP taking any diclofenac (Cataflam), Aspirin (unless otherwise instructed by your surgeon), Aleve, Naproxen, Ibuprofen, Motrin, Advil, Goody's, BC's, all herbal medications, fish oil, and all vitamins     Do not wear jewelry  Do not wear lotions, powders, or colognes, or deodorant.  Men may shave face and neck.  Do not bring valuables to the hospital.  [facility] is not responsible for any belongings or valuables.  Contacts, dentures or bridgework may not be worn into surgery.  Leave your suitcase in the car.  After surgery it may be brought to your room.  For patients admitted to the hospital, discharge time will be determined by your treatment team.  Patients discharged the day of surgery will not be allowed to drive home.    [facility]- Preparing For Surgery  Before surgery, you can play an important role. Because skin is not sterile, your skin needs to be as free of germs as possible. You can reduce the number of germs on your skin by washing with CHG (chlorahexidine gluconate) Soap before surgery.  CHG is an antiseptic cleaner which kills germs and bonds with the skin to continue killing germs even after washing.    Oral Hygiene is also important to reduce your risk of infection.  Remember - BRUSH YOUR TEETH THE MORNING OF SURGERY WITH YOUR REGULAR TOOTHPASTE  Please do not use if you have an allergy to CHG or antibacterial  soaps. If your skin becomes reddened/irritated stop using the CHG.  Do not shave (including legs and underarms) for at least 48 hours prior to first CHG shower. It is OK to shave your face.  Please follow these instructions carefully.   1. Shower the NIGHT BEFORE SURGERY and the MORNING OF SURGERY with CHG.   2. If you chose to wash your hair, wash your hair first as usual with your normal shampoo.  3. After you shampoo, rinse your hair and body thoroughly to remove the shampoo.  4. Use CHG as you would any other liquid soap. You can apply CHG directly to the skin and wash gently with a scrungie or a clean washcloth.   5. Apply the CHG Soap to your body ONLY FROM THE NECK DOWN.  Do not use on open wounds or open sores. Avoid contact with your eyes, ears, mouth and genitals (private parts). Wash Face and genitals (private parts)  with your normal soap.  6. Wash thoroughly, paying special attention to the area where your surgery will be performed.  7. Thoroughly rinse your body with warm water from the neck down.  8. DO NOT shower/wash with your normal soap after using and rinsing off the CHG Soap.  9. Pat yourself dry with a CLEAN TOWEL.  10. Wear CLEAN PAJAMAS to bed the night before surgery, wear comfortable clothes the morning of surgery  11. Place CLEAN SHEETS  on your bed the night of your first shower and DO NOT SLEEP WITH PETS.  Day of Surgery:  Do not apply any deodorants/lotions.  Please wear clean clothes to the hospital/surgery center.   Remember to brush your teeth WITH YOUR REGULAR TOOTHPASTE.  Please read over the following fact sheets that you were given.

## 2018-08-24 ENCOUNTER — Encounter (HOSPITAL_COMMUNITY)
Admission: RE | Admit: 2018-08-24 | Discharge: 2018-08-24 | Disposition: A | Discharge status: Home / Self Care | Payer: BLUE CROSS/BLUE SHIELD | Source: Ambulatory Visit | Attending: Neurosurgery | Admitting: Neurosurgery

## 2018-08-24 ENCOUNTER — Encounter (HOSPITAL_COMMUNITY): Payer: Self-pay

## 2018-08-24 DIAGNOSIS — Z01812 Encounter for preprocedural laboratory examination: Secondary | ICD-10-CM | POA: Insufficient documentation

## 2018-08-24 LAB — TYPE AND SCREEN
ABO/RH(D): O POS
Antibody Screen: NEGATIVE

## 2018-08-24 LAB — CBC
HCT: 47.2 % (ref 39.0–52.0)
Hemoglobin: 15.8 g/dL (ref 13.0–17.0)
MCH: 32.2 pg (ref 26.0–34.0)
MCHC: 33.5 g/dL (ref 30.0–36.0)
MCV: 96.1 fL (ref 80.0–100.0)
Platelets: 422 10*3/uL — ABNORMAL HIGH (ref 150–400)
RBC: 4.91 MIL/uL (ref 4.22–5.81)
RDW: 11.9 % (ref 11.5–15.5)
WBC: 13.4 10*3/uL — ABNORMAL HIGH (ref 4.0–10.5)
nRBC: 0 % (ref 0.0–0.2)

## 2018-08-24 LAB — SURGICAL PCR SCREEN
MRSA, PCR: POSITIVE — AB
Staphylococcus aureus: POSITIVE — AB

## 2018-08-24 LAB — ABO/RH: ABO/RH(D): O POS

## 2018-08-24 MED ORDER — CHLORHEXIDINE GLUCONATE CLOTH 2 % EX PADS: 6.0000 | MEDICATED_PAD | Freq: Once | CUTANEOUS | Status: DC

## 2018-08-30 MED ORDER — VANCOMYCIN HCL 10 G IV SOLR
1500.0000 mg | INTRAVENOUS | Status: AC
  Administered 2018-08-31: 1500 mg via INTRAVENOUS
  Filled 2018-08-30: qty 1500

## 2018-08-30 NOTE — H&P (Signed)
Chief Complaint   back pain  HPI   HPI: Keith [name] is a 43 y.o. male, s/p multiple left L5-S1 microdiskectomies by Dr Darrelyn Hillock, who developed recurrent left-sided buttock pain with radiation into the left lower extremity for approximately 1 year.  Symptoms have been managed conservatively with epidural injections.  Unfortunately about 2 months ago he suffered a work related injury and developed worsening pain in his back and left lower extremity.   He underwent a repeat MRI of his lumbar spine which showed recurrent left-sided L5-S1 disc herniation.  He presents today for surgery.  He is without any concerns.  Patient Active Problem List   Diagnosis Date Noted  . 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 Low back pain 09/24/2017  . Spinal stenosis, lumbar region, with neurogenic claudication 10/12/2013  . Herniated lumbar intervertebral disc 10/12/2013  . SINUSITIS, CHRONIC 11/30/2008    PMH: Past Medical History:  Diagnosis Date  . Arthritis   . Back pain with sciatica 10/04/13   left leg  . Heartburn     PSH: Past Surgical History:  Procedure Laterality Date  . BACK SURGERY     lumbar x 2  . CHOLECYSTECTOMY    . FOOT SURGERY    . HEMI-MICRODISCECTOMY LUMBAR LAMINECTOMY LEVEL 1 Left 10/12/2013   Procedure: REDO HEMI-LAMINECTOMY MICRODISCECTOMY L5-S1 ON LEFT ;  Surgeon: Jacki Cones, MD;  Location: [facility];  Service: Orthopedics;  Laterality: Left;  . KNEE SURGERY      No medications prior to admission.    SH: Social History   Tobacco Use  . Smoking status: Current Every Day Smoker    Packs/day: 1.00    Years: 25.00    Pack years: 25.00  . Smokeless tobacco: Never Used  Substance Use Topics  . Alcohol use: Yes    Comment: occ  . Drug use: No    MEDS: Prior to Admission medications   Medication Sig Start Date End Date Taking? Authorizing Provider  diclofenac (CATAFLAM) 50 MG tablet Take 50 mg by mouth 2 (two) times daily as needed (pain).  08/09/18   Yes [provider]  methylPREDNISolone (MEDROL DOSEPAK) 4 MG TBPK tablet Take 4 mg by mouth See admin instructions. Day 1: Take 8 mg by mouth before breakfast, 4 mg after lunch and dinner, and 8 mg at bedtime.  Day 2: Take 4 mg by mouth before breakfast, after lunch, after dinner and 8 mg at bedtime.  Day 3: Take 4 mg by mouth before breakfast, after lunch, after dinner and at bedtime. Day 4: Take 4 mg by mouth before breakfast, after lunch and at bedtime. Day 5: Take 4 mg by mouth before breakfast and at bedtime Day 6: Take 4 mg by mouth before breakfast 08/02/18  Yes [provider]  naproxen (NAPROSYN) 500 MG tablet Take 1 tablet (500 mg total) by mouth 2 (two) times daily. Patient not taking: Reported on 08/17/2018 07/20/18   Dione Booze, MD  orphenadrine (NORFLEX) 100 MG tablet Take 1 tablet (100 mg total) by mouth 2 (two) times daily. Patient not taking: Reported on 08/17/2018 07/20/18   Dione Booze, MD  oxyCODONE-acetaminophen (PERCOCET) 5-325 MG tablet Take 1 tablet by mouth every 6 (six) hours as needed for moderate pain. Patient not taking: Reported on 08/17/2018 07/27/18   Tarry Kos, MD    ALLERGY: No Known Allergies  Social History   Tobacco Use  . Smoking status: Current Every Day Smoker    Packs/day:  1.00    Years: 25.00    Pack years: 25.00  . Smokeless tobacco: Never Used  Substance Use Topics  . Alcohol use: Yes    Comment: occ     No family history on file.   ROS   ROS  Exam   There were no vitals filed for this visit. General appearance: WDWN, NAD Eyes: No scleral injection Cardiovascular: Regular rate and rhythm without murmurs, rubs, gallops. No edema or variciosities. Distal pulses normal. Pulmonary: Effort normal, non-labored breathing Musculoskeletal:     Muscle tone upper extremities: Normal    Muscle tone lower extremities: Normal    Motor exam: Upper Extremities Deltoid Bicep Tricep Grip  Right 5/5 5/5 5/5 5/5  Left 5/5  5/5 5/5 5/5   Lower Extremity IP Quad PF DF EHL  Right 5/5 5/5 5/5 5/5 5/5  Left 4-/5 4-/5 4-/5 2/5 3/5   Neurological Mental Status:    - Patient is awake, alert, oriented to person, place, month, year, and situation    - Patient is able to give a clear and coherent history.    - No signs of aphasia or neglect Cranial Nerves    - II: Visual Fields are full. PERRL    - III/IV/VI: EOMI without ptosis or diploplia.     - V: Facial sensation is grossly normal    - VII: Facial movement is symmetric.     - VIII: hearing is intact to voice    - X: Uvula elevates symmetrically    - XI: Shoulder shrug is symmetric.    - XII: tongue is midline without atrophy or fasciculations.  Sensory: Sensation grossly intact to LT Deep Tendon Reflexes    - 2+ and symmetric in the biceps and patellae.  Plantars   - Toes are downgoing bilaterally.  Cerebellar    - FNF and HKS are intact bilaterally   Results - Imaging/Labs   No results found for this or any previous visit (from the past 48 hour(s)).  No results found.  IMAGING: MRI of the lumbar spine dated 08/03/2018 was reviewed.  This demonstrates maintenance of normal lumbar lordosis.  There is minimal disc desiccation and minimal loss of height at L4-5 and L5-S1.  There is mild left-sided lateral recess stenosis at L4-5.  Primary finding is at L5-S1 where there is a left eccentric disc herniation, with compression of the traversing left S1 nerve root.  There is also evidence of prior left-sided L5 hemilaminotomy.  Impression/Plan   43 y.o. male with multiple recurrent left-sided L5-S1 disc herniations and continued left-sided radiculopathy despite reasonable conservative treatment.  we will plan on proceeding with decompression and fusion at L5-S1 including interbody device placement with arthrodesis, and posterior nonsegmental instrumentation using cortical pedicle screws.  While in the office the risks which include but are not limited to  nerve root injury leading to leg or foot weakness/numbness and/or bowel and bladder dysfunction, CSF leak, bleeding, and infection.  Possible outcomes of surgery were also discussed including the possibility of persistence or worsening of pain symptoms and the possibility of accelerated adjacent level degeneration. The general risks of anesthesia were also reviewed including heart attack, stroke, and DVT/PE.    The patient understood our discussion and is willing to proceed with surgical decompression and fusion.  All

## 2018-08-30 NOTE — Anesthesia Preprocedure Evaluation (Addendum)
Anesthesia Evaluation  Patient identified by MRN, date of birth, ID band Patient awake    Reviewed: Allergy & Precautions, H&P , NPO status , Patient's Chart, lab work & pertinent test results  Airway Mallampati: II  TM Distance: >3 FB Neck ROM: Full    Dental no notable dental hx. (+) Edentulous Upper, Dental Advisory Given   Pulmonary Current Smoker,    Pulmonary exam normal breath sounds clear to auscultation       Cardiovascular Exercise Tolerance: Good negative cardio ROS   Rhythm:Regular Rate:Normal     Neuro/Psych negative neurological ROS  negative psych ROS   GI/Hepatic negative GI ROS, Neg liver ROS,   Endo/Other  negative endocrine ROS  Renal/GU negative Renal ROS  negative genitourinary   Musculoskeletal  (+) Arthritis , Osteoarthritis,    Abdominal   Peds  Hematology negative hematology ROS (+)   Anesthesia Other Findings   Reproductive/Obstetrics negative OB ROS                            Anesthesia Physical Anesthesia Plan  ASA: II  Anesthesia Plan: General   Post-op Pain Management:    Induction: Intravenous  PONV Risk Score and Plan: 2 and Ondansetron, Dexamethasone and Midazolam  Airway Management Planned: Oral ETT  Additional Equipment:   Intra-op Plan:   Post-operative Plan: Extubation in OR  Informed Consent: I have reviewed the patients History and Physical, chart, labs and discussed the procedure including the risks, benefits and alternatives for the proposed anesthesia with the patient or authorized representative who has indicated his/her understanding and acceptance.     Dental advisory given  Plan Discussed with: CRNA  Anesthesia Plan Comments:         Anesthesia Quick Evaluation

## 2018-08-31 ENCOUNTER — Inpatient Hospital Stay (HOSPITAL_COMMUNITY)
Admission: RE | Admit: 2018-08-31 | Discharge: 2018-09-01 | DRG: 460 | Disposition: A | Discharge status: Home / Self Care | Payer: BLUE CROSS/BLUE SHIELD | Attending: Neurosurgery | Admitting: Neurosurgery

## 2018-08-31 ENCOUNTER — Inpatient Hospital Stay (HOSPITAL_COMMUNITY): Payer: BLUE CROSS/BLUE SHIELD | Admitting: Certified Registered Nurse Anesthetist

## 2018-08-31 ENCOUNTER — Other Ambulatory Visit: Payer: Self-pay

## 2018-08-31 ENCOUNTER — Inpatient Hospital Stay (HOSPITAL_COMMUNITY): Payer: BLUE CROSS/BLUE SHIELD

## 2018-08-31 ENCOUNTER — Encounter (HOSPITAL_COMMUNITY): Payer: Self-pay | Admitting: Surgery

## 2018-08-31 ENCOUNTER — Encounter (HOSPITAL_COMMUNITY)
Admission: RE | Disposition: A | Discharge status: Home / Self Care | Payer: Self-pay | Source: Home / Self Care | Attending: Neurosurgery

## 2018-08-31 DIAGNOSIS — G8929 Other chronic pain: Secondary | ICD-10-CM | POA: Diagnosis present

## 2018-08-31 DIAGNOSIS — M1990 Unspecified osteoarthritis, unspecified site: Secondary | ICD-10-CM | POA: Diagnosis present

## 2018-08-31 DIAGNOSIS — M549 Dorsalgia, unspecified: Secondary | ICD-10-CM | POA: Diagnosis not present

## 2018-08-31 DIAGNOSIS — M5117 Intervertebral disc disorders with radiculopathy, lumbosacral region: Principal | ICD-10-CM | POA: Diagnosis present

## 2018-08-31 DIAGNOSIS — F17210 Nicotine dependence, cigarettes, uncomplicated: Secondary | ICD-10-CM | POA: Diagnosis present

## 2018-08-31 DIAGNOSIS — M4727 Other spondylosis with radiculopathy, lumbosacral region: Secondary | ICD-10-CM | POA: Diagnosis not present

## 2018-08-31 DIAGNOSIS — M5416 Radiculopathy, lumbar region: Secondary | ICD-10-CM | POA: Diagnosis present

## 2018-08-31 DIAGNOSIS — M4327 Fusion of spine, lumbosacral region: Secondary | ICD-10-CM | POA: Diagnosis not present

## 2018-08-31 DIAGNOSIS — M5127 Other intervertebral disc displacement, lumbosacral region: Secondary | ICD-10-CM | POA: Diagnosis not present

## 2018-08-31 DIAGNOSIS — G9619 Other disorders of meninges, not elsewhere classified: Secondary | ICD-10-CM | POA: Diagnosis not present

## 2018-08-31 DIAGNOSIS — Z419 Encounter for procedure for purposes other than remedying health state, unspecified: Secondary | ICD-10-CM

## 2018-08-31 SURGERY — POSTERIOR LUMBAR FUSION 1 LEVEL
Anesthesia: General | Site: Spine Lumbar

## 2018-08-31 MED ORDER — THROMBIN 20000 UNITS EX SOLR
CUTANEOUS | Status: AC
  Filled 2018-08-31: qty 20000

## 2018-08-31 MED ORDER — SENNA 8.6 MG PO TABS
1.0000 | ORAL_TABLET | Freq: Two times a day (BID) | ORAL | Status: DC
  Administered 2018-08-31: 8.6 mg via ORAL
  Filled 2018-08-31: qty 1

## 2018-08-31 MED ORDER — ACETAMINOPHEN 650 MG RE SUPP: 650.0000 mg | RECTAL | Status: DC | PRN

## 2018-08-31 MED ORDER — 0.9 % SODIUM CHLORIDE (POUR BTL) OPTIME
TOPICAL | Status: DC | PRN
  Administered 2018-08-31: 1000 mL

## 2018-08-31 MED ORDER — BISACODYL 10 MG RE SUPP: 10.0000 mg | Freq: Every day | RECTAL | Status: DC | PRN

## 2018-08-31 MED ORDER — FENTANYL CITRATE (PF) 100 MCG/2ML IJ SOLN
INTRAMUSCULAR | Status: DC | PRN
  Administered 2018-08-31: 100 ug via INTRAVENOUS
  Administered 2018-08-31 (×6): 50 ug via INTRAVENOUS

## 2018-08-31 MED ORDER — ONDANSETRON HCL 4 MG/2ML IJ SOLN
INTRAMUSCULAR | Status: AC
  Filled 2018-08-31: qty 2

## 2018-08-31 MED ORDER — LACTATED RINGERS IV SOLN
INTRAVENOUS | Status: DC | PRN
  Administered 2018-08-31 (×2): via INTRAVENOUS

## 2018-08-31 MED ORDER — HYDROMORPHONE HCL 1 MG/ML IJ SOLN
0.2500 mg | INTRAMUSCULAR | Status: DC | PRN
  Administered 2018-08-31 (×4): 0.5 mg via INTRAVENOUS

## 2018-08-31 MED ORDER — ROCURONIUM BROMIDE 50 MG/5ML IV SOSY
PREFILLED_SYRINGE | INTRAVENOUS | Status: AC
  Filled 2018-08-31: qty 5

## 2018-08-31 MED ORDER — FLEET ENEMA 7-19 GM/118ML RE ENEM: 1.0000 | ENEMA | Freq: Once | RECTAL | Status: DC | PRN

## 2018-08-31 MED ORDER — DEXAMETHASONE SODIUM PHOSPHATE 10 MG/ML IJ SOLN
INTRAMUSCULAR | Status: DC | PRN
  Administered 2018-08-31: 10 mg via INTRAVENOUS

## 2018-08-31 MED ORDER — THROMBIN 5000 UNITS EX SOLR
CUTANEOUS | Status: AC
  Filled 2018-08-31: qty 5000

## 2018-08-31 MED ORDER — PHENOL 1.4 % MT LIQD: 1.0000 | OROMUCOSAL | Status: DC | PRN

## 2018-08-31 MED ORDER — ACETAMINOPHEN 325 MG PO TABS
650.0000 mg | ORAL_TABLET | ORAL | Status: DC | PRN
  Administered 2018-08-31: 650 mg via ORAL
  Administered 2018-09-01: 325 mg via ORAL
  Filled 2018-08-31 (×2): qty 2

## 2018-08-31 MED ORDER — SODIUM CHLORIDE 0.9% FLUSH: 3.0000 mL | INTRAVENOUS | Status: DC | PRN

## 2018-08-31 MED ORDER — ONDANSETRON HCL 4 MG PO TABS: 4.0000 mg | ORAL_TABLET | Freq: Four times a day (QID) | ORAL | Status: DC | PRN

## 2018-08-31 MED ORDER — SODIUM CHLORIDE 0.9 % IV SOLN
INTRAVENOUS | Status: DC | PRN
  Administered 2018-08-31: 500 mL

## 2018-08-31 MED ORDER — DEXAMETHASONE SODIUM PHOSPHATE 10 MG/ML IJ SOLN
INTRAMUSCULAR | Status: AC
  Filled 2018-08-31: qty 1

## 2018-08-31 MED ORDER — PROPOFOL 10 MG/ML IV BOLUS
INTRAVENOUS | Status: DC | PRN
  Administered 2018-08-31: 200 mg via INTRAVENOUS

## 2018-08-31 MED ORDER — FENTANYL CITRATE (PF) 250 MCG/5ML IJ SOLN
INTRAMUSCULAR | Status: AC
  Filled 2018-08-31: qty 5

## 2018-08-31 MED ORDER — SODIUM CHLORIDE 0.9% FLUSH: 3.0000 mL | Freq: Two times a day (BID) | INTRAVENOUS | Status: DC

## 2018-08-31 MED ORDER — MIDAZOLAM HCL 2 MG/2ML IJ SOLN
INTRAMUSCULAR | Status: AC
  Filled 2018-08-31: qty 2

## 2018-08-31 MED ORDER — ROCURONIUM BROMIDE 50 MG/5ML IV SOSY
PREFILLED_SYRINGE | INTRAVENOUS | Status: DC | PRN
  Administered 2018-08-31: 100 mg via INTRAVENOUS
  Administered 2018-08-31: 20 mg via INTRAVENOUS

## 2018-08-31 MED ORDER — MIDAZOLAM HCL 5 MG/5ML IJ SOLN
INTRAMUSCULAR | Status: DC | PRN
  Administered 2018-08-31: 2 mg via INTRAVENOUS

## 2018-08-31 MED ORDER — BUPIVACAINE HCL (PF) 0.5 % IJ SOLN
INTRAMUSCULAR | Status: AC
  Filled 2018-08-31: qty 30

## 2018-08-31 MED ORDER — PROPOFOL 10 MG/ML IV BOLUS
INTRAVENOUS | Status: AC
  Filled 2018-08-31: qty 40

## 2018-08-31 MED ORDER — ONDANSETRON HCL 4 MG/2ML IJ SOLN: 4.0000 mg | Freq: Four times a day (QID) | INTRAMUSCULAR | Status: DC | PRN

## 2018-08-31 MED ORDER — SODIUM CHLORIDE 0.9 % IV SOLN: INTRAVENOUS | Status: DC

## 2018-08-31 MED ORDER — LIDOCAINE-EPINEPHRINE 1 %-1:100000 IJ SOLN
INTRAMUSCULAR | Status: DC | PRN
  Administered 2018-08-31: 8.5 mL

## 2018-08-31 MED ORDER — ACETAMINOPHEN 500 MG PO TABS
1000.0000 mg | ORAL_TABLET | Freq: Four times a day (QID) | ORAL | Status: DC
  Administered 2018-08-31 (×2): 1000 mg via ORAL
  Filled 2018-08-31 (×2): qty 2

## 2018-08-31 MED ORDER — LIDOCAINE 2% (20 MG/ML) 5 ML SYRINGE
INTRAMUSCULAR | Status: AC
  Filled 2018-08-31: qty 5

## 2018-08-31 MED ORDER — GABAPENTIN 300 MG PO CAPS
300.0000 mg | ORAL_CAPSULE | Freq: Three times a day (TID) | ORAL | Status: DC
  Administered 2018-08-31 (×2): 300 mg via ORAL
  Filled 2018-08-31 (×2): qty 1

## 2018-08-31 MED ORDER — OXYCODONE HCL 5 MG PO TABS
5.0000 mg | ORAL_TABLET | ORAL | Status: DC | PRN
  Administered 2018-08-31 – 2018-09-01 (×3): 10 mg via ORAL
  Filled 2018-08-31 (×3): qty 2

## 2018-08-31 MED ORDER — LIDOCAINE-EPINEPHRINE 1 %-1:100000 IJ SOLN
INTRAMUSCULAR | Status: AC
  Filled 2018-08-31: qty 1

## 2018-08-31 MED ORDER — VANCOMYCIN HCL 10 G IV SOLR
1250.0000 mg | Freq: Once | INTRAVENOUS | Status: AC
  Administered 2018-08-31: 1250 mg via INTRAVENOUS
  Filled 2018-08-31: qty 1250

## 2018-08-31 MED ORDER — ROCURONIUM BROMIDE 50 MG/5ML IV SOSY
PREFILLED_SYRINGE | INTRAVENOUS | Status: AC
  Filled 2018-08-31: qty 10

## 2018-08-31 MED ORDER — METHOCARBAMOL 1000 MG/10ML IJ SOLN
500.0000 mg | Freq: Four times a day (QID) | INTRAVENOUS | Status: DC | PRN
  Filled 2018-08-31: qty 5

## 2018-08-31 MED ORDER — ONDANSETRON HCL 4 MG/2ML IJ SOLN
INTRAMUSCULAR | Status: DC | PRN
  Administered 2018-08-31: 4 mg via INTRAVENOUS

## 2018-08-31 MED ORDER — MENTHOL 3 MG MT LOZG: 1.0000 | LOZENGE | OROMUCOSAL | Status: DC | PRN

## 2018-08-31 MED ORDER — ACETAMINOPHEN 500 MG PO TABS
ORAL_TABLET | ORAL | Status: AC
  Administered 2018-08-31: 1000 mg via ORAL
  Filled 2018-08-31: qty 2

## 2018-08-31 MED ORDER — ACETAMINOPHEN 500 MG PO TABS
1000.0000 mg | ORAL_TABLET | Freq: Once | ORAL | Status: AC
  Administered 2018-08-31: 1000 mg via ORAL
  Filled 2018-08-31: qty 2

## 2018-08-31 MED ORDER — SUGAMMADEX SODIUM 200 MG/2ML IV SOLN
INTRAVENOUS | Status: DC | PRN
  Administered 2018-08-31: 250 mg via INTRAVENOUS

## 2018-08-31 MED ORDER — SENNOSIDES-DOCUSATE SODIUM 8.6-50 MG PO TABS: 1.0000 | ORAL_TABLET | Freq: Every evening | ORAL | Status: DC | PRN

## 2018-08-31 MED ORDER — METHOCARBAMOL 500 MG PO TABS
500.0000 mg | ORAL_TABLET | Freq: Four times a day (QID) | ORAL | Status: DC | PRN
  Administered 2018-08-31 – 2018-09-01 (×2): 500 mg via ORAL
  Filled 2018-08-31 (×2): qty 1

## 2018-08-31 MED ORDER — HYDROCODONE-ACETAMINOPHEN 5-325 MG PO TABS: 1.0000 | ORAL_TABLET | ORAL | Status: DC | PRN

## 2018-08-31 MED ORDER — HYDROMORPHONE HCL 1 MG/ML IJ SOLN
INTRAMUSCULAR | Status: AC
  Filled 2018-08-31: qty 1

## 2018-08-31 MED ORDER — BUPIVACAINE HCL (PF) 0.5 % IJ SOLN
INTRAMUSCULAR | Status: DC | PRN
  Administered 2018-08-31: 8.5 mL

## 2018-08-31 MED ORDER — DOCUSATE SODIUM 100 MG PO CAPS
100.0000 mg | ORAL_CAPSULE | Freq: Two times a day (BID) | ORAL | Status: DC
  Administered 2018-08-31: 100 mg via ORAL
  Filled 2018-08-31: qty 1

## 2018-08-31 MED ORDER — THROMBIN 5000 UNITS EX SOLR
OROMUCOSAL | Status: DC | PRN
  Administered 2018-08-31: 5 mL via TOPICAL

## 2018-08-31 MED ORDER — LIDOCAINE 2% (20 MG/ML) 5 ML SYRINGE
INTRAMUSCULAR | Status: DC | PRN
  Administered 2018-08-31: 60 mg via INTRAVENOUS

## 2018-08-31 MED ORDER — HYDROMORPHONE HCL 1 MG/ML IJ SOLN: 0.5000 mg | INTRAMUSCULAR | Status: DC | PRN

## 2018-08-31 SURGICAL SUPPLY — 84 items
ADH SKN CLS APL DERMABOND .7 (GAUZE/BANDAGES/DRESSINGS) ×2
APL SKNCLS STERI-STRIP NONHPOA (GAUZE/BANDAGES/DRESSINGS)
BAG DECANTER FOR FLEXI CONT (MISCELLANEOUS) ×2 IMPLANT
BASKET BONE COLLECTION (BASKET) ×2 IMPLANT
BENZOIN TINCTURE PRP APPL 2/3 (GAUZE/BANDAGES/DRESSINGS) IMPLANT
BIT DRILL 3.5 POWEREASE (BIT) ×1 IMPLANT
BLADE CLIPPER SURG (BLADE) ×1 IMPLANT
BLADE SURG 11 STRL SS (BLADE) ×2 IMPLANT
BUR MATCHSTICK NEURO 3.0 LAGG (BURR) ×2 IMPLANT
BUR PRECISION FLUTE 5.0 (BURR) ×2 IMPLANT
CANISTER SUCT 3000ML PPV (MISCELLANEOUS) ×2 IMPLANT
CARTRIDGE OIL MAESTRO DRILL (MISCELLANEOUS) ×1 IMPLANT
CONT SPEC 4OZ CLIKSEAL STRL BL (MISCELLANEOUS) ×2 IMPLANT
COVER BACK TABLE 60X90IN (DRAPES) ×2 IMPLANT
COVER WAND RF STERILE (DRAPES) ×1 IMPLANT
DECANTER SPIKE VIAL GLASS SM (MISCELLANEOUS) ×3 IMPLANT
DERMABOND ADVANCED (GAUZE/BANDAGES/DRESSINGS) ×2
DERMABOND ADVANCED .7 DNX12 (GAUZE/BANDAGES/DRESSINGS) ×1 IMPLANT
DEVICE INTERBODY ELEVATE 23X8 (Cage) ×4 IMPLANT
DIFFUSER DRILL AIR PNEUMATIC (MISCELLANEOUS) ×2 IMPLANT
DRAPE C-ARM 42X72 X-RAY (DRAPES) ×2 IMPLANT
DRAPE C-ARMOR (DRAPES) ×2 IMPLANT
DRAPE LAPAROTOMY 100X72X124 (DRAPES) ×2 IMPLANT
DRAPE SHEET LG 3/4 BI-LAMINATE (DRAPES) ×1 IMPLANT
DRAPE SURG 17X23 STRL (DRAPES) ×2 IMPLANT
DRSG OPSITE POSTOP 4X6 (GAUZE/BANDAGES/DRESSINGS) ×1 IMPLANT
DURAPREP 26ML APPLICATOR (WOUND CARE) ×2 IMPLANT
ELECT REM PT RETURN 9FT ADLT (ELECTROSURGICAL) ×2
ELECTRODE REM PT RTRN 9FT ADLT (ELECTROSURGICAL) ×1 IMPLANT
GAUZE 4X4 16PLY RFD (DISPOSABLE) IMPLANT
GAUZE SPONGE 4X4 12PLY STRL (GAUZE/BANDAGES/DRESSINGS) IMPLANT
GLOVE BIO SURGEON STRL SZ7.5 (GLOVE) ×2 IMPLANT
GLOVE BIOGEL PI IND STRL 6.5 (GLOVE) IMPLANT
GLOVE BIOGEL PI IND STRL 7.0 (GLOVE) IMPLANT
GLOVE BIOGEL PI IND STRL 7.5 (GLOVE) ×2 IMPLANT
GLOVE BIOGEL PI IND STRL 8.5 (GLOVE) IMPLANT
GLOVE BIOGEL PI INDICATOR 6.5 (GLOVE) ×1
GLOVE BIOGEL PI INDICATOR 7.0 (GLOVE) ×3
GLOVE BIOGEL PI INDICATOR 7.5 (GLOVE) ×3
GLOVE BIOGEL PI INDICATOR 8.5 (GLOVE) ×1
GLOVE ECLIPSE 7.0 STRL STRAW (GLOVE) ×4 IMPLANT
GLOVE ECLIPSE 8.5 STRL (GLOVE) ×1 IMPLANT
GLOVE EXAM NITRILE XL STR (GLOVE) IMPLANT
GLOVE SURG SS PI 7.0 STRL IVOR (GLOVE) ×1 IMPLANT
GOWN STRL REUS W/ TWL LRG LVL3 (GOWN DISPOSABLE) ×4 IMPLANT
GOWN STRL REUS W/ TWL XL LVL3 (GOWN DISPOSABLE) IMPLANT
GOWN STRL REUS W/TWL 2XL LVL3 (GOWN DISPOSABLE) ×1 IMPLANT
GOWN STRL REUS W/TWL LRG LVL3 (GOWN DISPOSABLE) ×10
GOWN STRL REUS W/TWL XL LVL3 (GOWN DISPOSABLE) ×2
HEMOSTAT POWDER KIT SURGIFOAM (HEMOSTASIS) ×2 IMPLANT
KIT BASIN OR (CUSTOM PROCEDURE TRAY) ×2 IMPLANT
KIT INFUSE XX SMALL 0.7CC (Orthopedic Implant) ×1 IMPLANT
KIT POSITION SURG JACKSON T1 (MISCELLANEOUS) ×2 IMPLANT
KIT TURNOVER KIT B (KITS) ×2 IMPLANT
MILL MEDIUM DISP (BLADE) ×2 IMPLANT
NDL HYPO 18GX1.5 BLUNT FILL (NEEDLE) IMPLANT
NDL SPNL 18GX3.5 QUINCKE PK (NEEDLE) IMPLANT
NEEDLE HYPO 18GX1.5 BLUNT FILL (NEEDLE) IMPLANT
NEEDLE HYPO 22GX1.5 SAFETY (NEEDLE) ×2 IMPLANT
NEEDLE SPNL 18GX3.5 QUINCKE PK (NEEDLE) ×2 IMPLANT
NS IRRIG 1000ML POUR BTL (IV SOLUTION) ×2 IMPLANT
OIL CARTRIDGE MAESTRO DRILL (MISCELLANEOUS) ×2
PACK LAMINECTOMY NEURO (CUSTOM PROCEDURE TRAY) ×2 IMPLANT
PAD ARMBOARD 7.5X6 YLW CONV (MISCELLANEOUS) ×6 IMPLANT
ROD COBALT 47.5X35 (Rod) ×2 IMPLANT
SCREW 5.5X30MM (Screw) ×2 IMPLANT
SCREW 5.5X35MM (Screw) ×4 IMPLANT
SCREW BN 35X5.5XMA NS SPNE (Screw) IMPLANT
SCREW SET SOLERA (Screw) ×8 IMPLANT
SCREW SET SOLERA TI (Screw) IMPLANT
SPACER SPNL XLORDOTIC 23X8X (Cage) IMPLANT
SPCR SPNL XLORDOTIC 23X8X (Cage) ×2 IMPLANT
SPONGE LAP 4X18 RFD (DISPOSABLE) IMPLANT
SPONGE SURGIFOAM ABS GEL 100 (HEMOSTASIS) IMPLANT
STRIP CLOSURE SKIN 1/2X4 (GAUZE/BANDAGES/DRESSINGS) IMPLANT
SUT VIC AB 0 CT1 18XCR BRD8 (SUTURE) ×1 IMPLANT
SUT VIC AB 0 CT1 8-18 (SUTURE) ×4
SUT VIC AB 2-0 CT1 18 (SUTURE) ×1 IMPLANT
SUT VICRYL 3-0 RB1 18 ABS (SUTURE) ×3 IMPLANT
SYR 3ML LL SCALE MARK (SYRINGE) ×6 IMPLANT
TOWEL GREEN STERILE (TOWEL DISPOSABLE) ×2 IMPLANT
TOWEL GREEN STERILE FF (TOWEL DISPOSABLE) ×2 IMPLANT
TRAY FOLEY MTR SLVR 16FR STAT (SET/KITS/TRAYS/PACK) ×2 IMPLANT
WATER STERILE IRR 1000ML POUR (IV SOLUTION) ×2 IMPLANT

## 2018-08-31 NOTE — Anesthesia Procedure Notes (Signed)
Procedure Name: Intubation Date/Time: 08/31/2018 7:48 AM Performed by: Roderic Palau, MD Pre-anesthesia Checklist: Patient identified Patient Re-evaluated:Patient Re-evaluated prior to induction Oxygen Delivery Method: Circle system utilized Preoxygenation: Pre-oxygenation with 100% oxygen Induction Type: IV induction Ventilation: Mask ventilation without difficulty Laryngoscope Size: Mac and 3 Grade View: Grade I Tube type: Oral Tube size: 7.5 mm Number of attempts: 1 Airway Equipment and Method: Patient positioned with wedge pillow and Stylet Placement Confirmation: ETT inserted through vocal cords under direct vision,  positive ETCO2 and breath sounds checked- equal and bilateral Secured at: 23 cm Tube secured with: Tape Dental Injury: Teeth and Oropharynx as per pre-operative assessment  Comments: Inserted by Raymond Gurney, SRNA

## 2018-08-31 NOTE — Progress Notes (Signed)
Pharmacy Antibiotic Note  Keith [name] is a 43 y.o. male admitted on 08/31/2018 for lumbar surgery. Pharmacy has been consulted for Vancomycin for  surgical prophylaxis x 1 dose post-op.  Not PCN allergic, but pre-op MRSA PCR was positive on 08/24/18.  RN reports that he finished 5 days of + MRSA treatment as outpatient.     Vancomycin 1500 mg IV x 1 given pre-op at 0650am.  No drain.   No chemistries, but no hx renal issues, estimate crcl ~100 ml/min.  Plan:  Vancomycin 1250 mg IV x 1 at 6pm tonight.  No follow up needed.  Pharmacy signing off.  Height: 5' 10.5" (179.1 cm) Weight: 260 lb (117.9 kg) IBW/kg (Calculated) : 74.15  Temp (24hrs), Avg:97.8 F (36.6 C), Min:97.7 F (36.5 C), Max:97.9 F (36.6 C)  Recent Labs  Lab 08/24/18 1514  WBC 13.4*    No Known Allergies  Antimicrobials this admission:  Vancomycin 2/4 pre-op x 1 and post-op x 1  Microbiology results:  1/28 MRSA PCR positive  Thank you for allowing pharmacy to be a part of this patient's care.  Dennie Fetters, [place] Pager: [phone] or phone: [phone] 08/31/2018 1:21 PM

## 2018-08-31 NOTE — Op Note (Signed)
NEUROSURGERY OPERATIVE NOTE   PREOP DIAGNOSIS:  1. Recurrent disc herniation, L5-S1  POSTOP DIAGNOSIS: Same  PROCEDURE: 1. Complete L5 laminectomy with radical facetectomy for decompression of exiting nerve roots, more than would be required for placement of interbody graft 2. Placement of anterior interbody device - [employer] expandable 48mm lordotic cage x2 3. Posterior non-segmental instrumentation using cortical pedicle screws at L5 - S1 4. Interbody arthrodesis, L5-S1 5. Use of locally harvested bone autograft 6. Use of non-structural bone allograft - BMP  SURGEON: Dr. Lisbeth Renshaw, MD  ASSISTANT: Dr. Barnett Abu, MD  ANESTHESIA: General Endotracheal  EBL: 200cc  SPECIMENS: None  DRAINS: None  COMPLICATIONS: None immediate  CONDITION: Hemodynamically stable to PACU  HISTORY: Keith [name] is a 43 y.o. male who has been followed in the outpatient clinic with left-sided back and leg pain related to spondylosis and disc herniation at L5-S1 on the left. He has undergone multiple previous decompression/discectomy surgeries for the same problem. He attempted conservative treatments but ultimately elected to proceed with surgical decompression and fusion. Risks and benefits were reviewed and consent was obtained.  PROCEDURE IN DETAIL: After informed consent was obtained and witnessed, the patient was brought to the operating room. After induction of general anesthesia, the patient was positioned on the operative table in the prone position. All pressure points were meticulously padded. Previous incision was then marked out and prepped and draped in the usual sterile fashion.  After timeout was conducted, skin was infiltrated with local anesthetic. Skin incision was then made sharply and Bovie electrocautery was used to dissect the subcutaneous tissue until the lumbodorsal fascia was identified and incised. The muscle was then elevated in the subperiosteal plane and the L5  and S1 lamina as well as the L5-S1 facet complexes were identified.  There was a significant amount of scar tissue along the left L5 hemilamina.  The laminotomy defect was identified on the inferior portion of the left L5 hemilamina.  Self-retaining retractors were then placed.  At this point attention was turned to decompression. Complete L5 laminectomy with facetectomy was completed using a combination of Kerrison rongeurs and a high-speed drill.  The superior articulating process of L5 was removed bilaterally.  Bilateral L5 and S1 nerve roots were identified.  There did not appear to be any significant compression on the right side.  On the left side, there was a significant amount of ventral epidural fibrosis preventing any real retraction of the thecal sac or S1 root.  There did appear to be a disc herniation compressing primarily the left S1 root.  After removal of the superior articulating processes, good decompression of the exiting and traversing roots was confirmed.   Disc space was then identified and incised initially on the right.  Using a combination of shavers, curettes and rongeurs, discectomy was completed.  A disc space spreader was then placed on the right side, and the disc space was then incised on the left.  Again, using a combination of shavers, curettes, and rongeurs, discectomy from the left side was also completed.  I was then able to pass a curette and a ball-tipped dissector in the ventral epidural space indicating good decompression.  Endplates were prepared, and bone harvested during decompression was mixed with BMP and packed into the interspace. A 8 mm expandable cage was tapped into place and expanded to 12 mm bilaterally.  Good position was confirmed with fluoroscopy.  At this point, the L5 and S1 cortical pedicle screw entry points were identified  using standard anatomic landmarks and lateral fluoroscopy.  Pilot holes were then drilled and tapped, and pedicle screws were  placed in L5 and S1 measuring 5.5 x 35 mm at L5 and 5.5 x 30 mm and S1. Rod was then placed, set screws placed and final tightened. Final AP and lateral fluoroscopic images confirmed good position.  Of note, although the left L5 pedicle screw appeared to be slightly more medial, I was able to fully identify the left L5 nerve root as well as the medial border of the pedicle, and there was no identifiable pedicle breach.  The wound was then irrigated with copious amounts of antibiotic saline, then closed in standard fashion using a combination of interrupted 0 and 3-0 Vicryl stitches in the muscular, fascial, and subcutaneous layers. Skin was then closed using standard Dermabond. Sterile dressing was then applied. The patient was then transferred to the stretcher, extubated, and taken to the postanesthesia care unit in stable hemodynamic condition.  At the end of the case all sponge, needle, cottonoid, and instrument counts were correct.

## 2018-08-31 NOTE — Anesthesia Postprocedure Evaluation (Signed)
Anesthesia Post Note  Patient: Keith [name]  Procedure(s) Performed: Posterior Lumbar Interbody fusion Lumbar Five-Sacral One;  Interbody device, interbody arthrodesis, posterior non-segmental instrumentation (N/A Spine Lumbar)     Patient location during evaluation: PACU Anesthesia Type: General Level of consciousness: awake and alert Pain management: pain level controlled Vital Signs Assessment: post-procedure vital signs reviewed and stable Respiratory status: spontaneous breathing, nonlabored ventilation and respiratory function stable Cardiovascular status: blood pressure returned to baseline and stable Postop Assessment: no apparent nausea or vomiting Anesthetic complications: no    Last Vitals:  Vitals:   08/31/18 1200 08/31/18 1203  BP:  (!) 148/96  Pulse: 86 90  Resp: 12 18  Temp:    SpO2: 91% 94%    Last Pain:  Vitals:   08/31/18 1215  TempSrc:   PainSc: 10-Worst pain ever                 Tinie Mcgloin,W. EDMOND

## 2018-08-31 NOTE — Evaluation (Signed)
Physical Therapy Evaluation Patient Details Name: Keith [name] MRN: [id number] DOB: 03-Dec-1975 Today's Date: 08/31/2018   History of Present Illness  Pt is a 43 y.o. M with significant PMH of multiple left L5-S1 microdiskectomies who is s/p posterior lumbar interbody fusion L5-S1  Clinical Impression  Patient is s/p above surgery resulting in the deficits listed below (see PT Problem List). Presenting with decreased hamstring flexibility, decreased activity tolerance, abnormal posture, and back pain. Ambulating 400 feet with walker and supervision. Education provided on spinal precautions in regards to ADL's, car transfer technique, and generalized walking program. Patient will benefit from skilled PT to increase their independence and safety with mobility (while adhering to their precautions) to allow discharge to the venue listed below.     Follow Up Recommendations No PT follow up    Equipment Recommendations  None recommended by PT    Recommendations for Other Services       Precautions / Restrictions Precautions Precautions: Back Precaution Booklet Issued: Yes (comment) Precaution Comments: Pt able to recall 1/3 precautions.  Required Braces or Orthoses: Spinal Brace Spinal Brace: Applied in sitting position;Lumbar corset Restrictions Weight Bearing Restrictions: No      Mobility  Bed Mobility Overal bed mobility: Modified Independent             General bed mobility comments: cues for log roll technique  Transfers Overall transfer level: Modified independent Equipment used: None                Ambulation/Gait Ambulation/Gait assistance: Supervision Gait Distance (Feet): 400 Feet Assistive device: Rolling walker (2 wheeled) Gait Pattern/deviations: Step-through pattern;Trunk flexed;Decreased dorsiflexion - right;Decreased dorsiflexion - left     General Gait Details: Lightly utilizing walker for balance, cues for upright posture   Stairs             Wheelchair Mobility    Modified Rankin (Stroke Patients Only)       Balance Overall balance assessment: Mild deficits observed, not formally tested                                           Pertinent Vitals/Pain Pain Assessment: 0-10 Pain Score: 4  Pain Location: incisional Pain Descriptors / Indicators: Guarding;Discomfort Pain Intervention(s): Monitored during session    Home Living Family/patient expects to be discharged to:: Private residence Living Arrangements: Parent(father) Available Help at Discharge: Family Type of Home: House Home Access: Stairs to enter   Secretary/administrator of Steps: 1 Home Layout: One level Home Equipment: Crutches;Shower seat;Toilet riser      Prior Function Level of Independence: Independent         Comments: On disability from work, reports he does not exercise     Hand Dominance        Extremity/Trunk Assessment   Upper Extremity Assessment Upper Extremity Assessment: Overall WFL for tasks assessed    Lower Extremity Assessment Lower Extremity Assessment: RLE deficits/detail;LLE deficits/detail RLE Deficits / Details: Tight hamstrings LLE Deficits / Details: Tight hamstrings (LLE > RLE)       Communication   Communication: No difficulties  Cognition Arousal/Alertness: Awake/alert Behavior During Therapy: WFL for tasks assessed/performed Overall Cognitive Status: Within Functional Limits for tasks assessed  General Comments      Exercises Other Exercises Other Exercises: Demonstrated supine/sitting hamstring stretch    Assessment/Plan    PT Assessment Patient needs continued PT services  PT Problem List Decreased range of motion;Decreased activity tolerance;Decreased balance;Decreased mobility;Pain       PT Treatment Interventions DME instruction;Gait training;Stair training;Functional mobility training;Therapeutic  activities;Therapeutic exercise;Balance training;Patient/family education    PT Goals (Current goals can be found in the Care Plan section)  Acute Rehab PT Goals Patient Stated Goal: "Do this thing right." PT Goal Formulation: With patient Time For Goal Achievement: 09/02/18 Potential to Achieve Goals: Good    Frequency Min 5X/week   Barriers to discharge        Co-evaluation               AM-PAC PT "6 Clicks" Mobility  Outcome Measure Help needed turning from your back to your side while in a flat bed without using bedrails?: None Help needed moving from lying on your back to sitting on the side of a flat bed without using bedrails?: None Help needed moving to and from a bed to a chair (including a wheelchair)?: None Help needed standing up from a chair using your arms (e.g., wheelchair or bedside chair)?: None Help needed to walk in hospital room?: None Help needed climbing 3-5 steps with a railing? : A Little 6 Click Score: 23    End of Session Equipment Utilized During Treatment: Back brace Activity Tolerance: Patient tolerated treatment well Patient left: in bed;with call bell/phone within reach;with family/visitor present Nurse Communication: Mobility status PT Visit Diagnosis: Difficulty in walking, not elsewhere classified (R26.2);Pain Pain - part of body: (back)    Time: 1642-1700 PT Time Calculation (min) (ACUTE ONLY): 18 min   Charges:   PT Evaluation $PT Eval Low Complexity: 1 Low        Laurina Bustle, PT, DPT Acute Rehabilitation Services Pager [phone] Office [phone]   Vanetta Mulders 08/31/2018, 5:18 PM

## 2018-08-31 NOTE — Transfer of Care (Signed)
Immediate Anesthesia Transfer of Care Note  Patient: Keith [name]  Procedure(s) Performed: Posterior Lumbar Interbody fusion Lumbar Five-Sacral One;  Interbody device, interbody arthrodesis, posterior non-segmental instrumentation (N/A Spine Lumbar)  Patient Location: PACU  Anesthesia Type:General  Level of Consciousness: drowsy  Airway & Oxygen Therapy: Patient Spontanous Breathing and Patient connected to face mask oxygen  Post-op Assessment: Report given to RN and Post -op Vital signs reviewed and stable  Post vital signs: stable  Last Vitals:  Vitals Value Taken Time  BP 128/70 08/31/2018 11:15 AM  Temp    Pulse 90 08/31/2018 11:17 AM  Resp 15 08/31/2018 11:17 AM  SpO2 100 % 08/31/2018 11:17 AM  Vitals shown include unvalidated device data.  Last Pain:  Vitals:   08/31/18 0639  TempSrc: Oral  PainSc:       Patients Stated Pain Goal: 2 (08/31/18 0604)  Complications: No apparent anesthesia complications

## 2018-09-01 LAB — CBC
HCT: 42.3 % (ref 39.0–52.0)
Hemoglobin: 14.6 g/dL (ref 13.0–17.0)
MCH: 33.5 pg (ref 26.0–34.0)
MCHC: 34.5 g/dL (ref 30.0–36.0)
MCV: 97 fL (ref 80.0–100.0)
Platelets: 376 10*3/uL (ref 150–400)
RBC: 4.36 MIL/uL (ref 4.22–5.81)
RDW: 11.7 % (ref 11.5–15.5)
WBC: 16.6 10*3/uL — ABNORMAL HIGH (ref 4.0–10.5)
nRBC: 0 % (ref 0.0–0.2)

## 2018-09-01 LAB — BASIC METABOLIC PANEL
Anion gap: 11 (ref 5–15)
BUN: 7 mg/dL (ref 6–20)
CO2: 26 mmol/L (ref 22–32)
Calcium: 9.3 mg/dL (ref 8.9–10.3)
Chloride: 102 mmol/L (ref 98–111)
Creatinine, Ser: 0.9 mg/dL (ref 0.61–1.24)
GFR calc Af Amer: >60 mL/min (ref >60)
GFR calc non Af Amer: >60 mL/min (ref >60)
Glucose, Bld: 127 mg/dL — ABNORMAL HIGH (ref 70–99)
Potassium: 3.6 mmol/L (ref 3.5–5.1)
Sodium: 139 mmol/L (ref 135–145)

## 2018-09-01 LAB — PROTIME-INR
INR: 0.92
Prothrombin Time: 12.3 s (ref 11.4–15.2)

## 2018-09-01 LAB — APTT: aPTT: 26 seconds (ref 24–36)

## 2018-09-01 MED ORDER — OXYCODONE-ACETAMINOPHEN 5-325 MG PO TABS: 1.0000 | ORAL_TABLET | ORAL | 0 refills | Status: AC | PRN

## 2018-09-01 MED ORDER — METHOCARBAMOL 500 MG PO TABS: 500.0000 mg | ORAL_TABLET | Freq: Four times a day (QID) | ORAL | 2 refills | Status: DC | PRN

## 2018-09-01 MED ORDER — ALUM & MAG HYDROXIDE-SIMETH 200-200-20 MG/5ML PO SUSP
30.0000 mL | ORAL | Status: DC | PRN
  Administered 2018-09-01: 30 mL via ORAL
  Filled 2018-09-01: qty 30

## 2018-09-01 NOTE — Progress Notes (Signed)
Physical Therapy Treatment and Discharge Patient Details Name: Keith [name] MRN: [id number] DOB: 09-Mar-1976 Today's Date: 09/01/2018    History of Present Illness Pt is a 43 y.o. M with significant PMH of multiple left L5-S1 microdiskectomies who is s/p posterior lumbar interbody fusion L5-S1    PT Comments    Pt progressing well with post-op mobility. Pt was able to demonstrate modified independence with transfers and ambulation without an AD. Pt was educated on improved posture, precautions, car transfer, and safe activity progression. Will sign off at this time as pt has met acute PT goals. If needs change, please reconsult.    Follow Up Recommendations  No PT follow up     Equipment Recommendations  None recommended by PT    Recommendations for Other Services       Precautions / Restrictions Precautions Precautions: Back Precaution Booklet Issued: Yes (comment) Precaution Comments: Pt able to recall 1/3 precautions.  Required Braces or Orthoses: Spinal Brace Spinal Brace: Applied in sitting position;Lumbar corset Restrictions Weight Bearing Restrictions: No    Mobility  Bed Mobility Overal bed mobility: Modified Independent             General bed mobility comments: HOB flat and rails lowered to simulate home environment.   Transfers Overall transfer level: Modified independent Equipment used: None             General transfer comment: Pt demonstrated proper hand placement on seated surface for safety.   Ambulation/Gait Ambulation/Gait assistance: Modified independent (Device/Increase time) Gait Distance (Feet): 250 Feet Assistive device: None Gait Pattern/deviations: Step-through pattern;Trunk flexed;Decreased dorsiflexion - right;Decreased dorsiflexion - left;Wide base of support(External rotation BLE's) Gait velocity: Decreased Gait velocity interpretation: 1.31 - 2.62 ft/sec, indicative of limited community ambulator General Gait Details: VC's  for improved posture and core activation during OOB mobility.    Stairs Stairs: (Pt declined - no stairs at dad's house (will be there 4 wks))           Wheelchair Mobility    Modified Rankin (Stroke Patients Only)       Balance Overall balance assessment: Mild deficits observed, not formally tested                                          Cognition Arousal/Alertness: Awake/alert Behavior During Therapy: WFL for tasks assessed/performed Overall Cognitive Status: Within Functional Limits for tasks assessed                                        Exercises      General Comments        Pertinent Vitals/Pain Pain Assessment: Faces Faces Pain Scale: Hurts little more Pain Location: incisional Pain Descriptors / Indicators: Guarding;Discomfort Pain Intervention(s): Monitored during session    Home Living Family/patient expects to be discharged to:: Private residence Living Arrangements: Parent(father) Available Help at Discharge: Family Type of Home: House Home Access: Stairs to enter   Home Layout: One level Home Equipment: Crutches;Shower seat;Toilet riser      Prior Function Level of Independence: Independent      Comments: On disability from work, reports he does not exercise   PT Goals (current goals can now be found in the care plan section) Acute Rehab PT Goals Patient Stated Goal: Home today PT Goal Formulation:  With patient Time For Goal Achievement: 09/02/18 Potential to Achieve Goals: Good Progress towards PT goals: Progressing toward goals    Frequency    Min 5X/week      PT Plan Current plan remains appropriate    Co-evaluation              AM-PAC PT "6 Clicks" Mobility   Outcome Measure  Help needed turning from your back to your side while in a flat bed without using bedrails?: None Help needed moving from lying on your back to sitting on the side of a flat bed without using bedrails?:  None Help needed moving to and from a bed to a chair (including a wheelchair)?: None Help needed standing up from a chair using your arms (e.g., wheelchair or bedside chair)?: None Help needed to walk in hospital room?: None Help needed climbing 3-5 steps with a railing? : A Little 6 Click Score: 23    End of Session Equipment Utilized During Treatment: Back brace Activity Tolerance: Patient tolerated treatment well Patient left: in bed;with call bell/phone within reach;with family/visitor present Nurse Communication: Mobility status PT Visit Diagnosis: Difficulty in walking, not elsewhere classified (R26.2);Pain Pain - part of body: (back)     Time: 9494-4739 PT Time Calculation (min) (ACUTE ONLY): 17 min  Charges:  $Gait Training: 8-22 mins                     Rolinda Roan, PT, DPT Acute Rehabilitation Services Pager: [phone] Office: [phone]    Thelma Comp 09/01/2018, 8:07 AM

## 2018-09-01 NOTE — Discharge Summary (Signed)
Physician Discharge Summary  Patient ID: Keith [name] MRN: [id number] DOB/AGE: 43-Jul-1977 43 y.o.  Admit date: 08/31/2018 Discharge date: 09/01/2018  Admission Diagnoses:  Lumbar radiculopathy  Discharge Diagnoses:  Same Active Problems:   Lumbar radiculopathy  Discharged Condition: Stable  Hospital Course:  Keith [name] is a 43 y.o. male who was admitted for the below procedure. There were no post operative complications. He had complete resolution in pre op pain. At time of discharge, pain was well controlled, ambulating with Pt/OT, tolerating po, voiding normal. Ready for discharge.  Treatments: Surgery 1. Complete L5 laminectomy with radical facetectomy for decompression of exiting nerve roots, more than would be required for placement of interbody graft 2. Placement of anterior interbody device - [employer] expandable 8mm lordotic cage x2 3. Posterior non-segmental instrumentation using cortical pedicle screws at L5 - S1 4. Interbody arthrodesis, L5-S1 5. Use of locally harvested bone autograft 6. Use of non-structural bone allograft - BMP  Discharge Exam: Blood pressure 125/80, pulse 72, temperature 98.2 F (36.8 C), temperature source Oral, resp. rate 16, height 5' 10.5" (1.791 m), weight 117.9 kg, SpO2 99 %. Awake, alert, oriented Speech fluent, appropriate CN grossly intact MAEW with symmetric strength Wound c/d/i  Disposition: Discharge disposition: 01-Home or Self Care       Discharge Instructions    Call MD for:  difficulty breathing, headache or visual disturbances   Complete by:  As directed    Call MD for:  persistant dizziness or light-headedness   Complete by:  As directed    Call MD for:  redness, tenderness, or signs of infection (pain, swelling, redness, odor or green/yellow discharge around incision site)   Complete by:  As directed    Call MD for:  severe uncontrolled pain   Complete by:  As directed    Call MD for:  temperature >100.4    Complete by:  As directed    Diet general   Complete by:  As directed    Driving Restrictions   Complete by:  As directed    Do not drive until given clearance.   Increase activity slowly   Complete by:  As directed    Lifting restrictions   Complete by:  As directed    Do not lift anything >10lbs. Avoid bending and twisting in awkward positions. Avoid bending at the back.   May shower / Bathe   Complete by:  As directed    In 24 hours. Okay to wash wound with warm soapy water. Avoid scrubbing the wound. Pat dry.   Remove dressing in 24 hours   Complete by:  As directed      Allergies as of 09/01/2018   No Known Allergies     Medication List    STOP taking these medications   methylPREDNISolone 4 MG Tbpk tablet Commonly known as:  MEDROL DOSEPAK   naproxen 500 MG tablet Commonly known as:  NAPROSYN   orphenadrine 100 MG tablet Commonly known as:  NORFLEX     TAKE these medications   diclofenac 50 MG tablet Commonly known as:  CATAFLAM Take 50 mg by mouth 2 (two) times daily as needed (pain).   methocarbamol 500 MG tablet Commonly known as:  ROBAXIN Take 1 tablet (500 mg total) by mouth every 6 (six) hours as needed for muscle spasms.   oxyCODONE-acetaminophen 5-325 MG tablet Commonly known as:  PERCOCET Take 1 tablet by mouth every 4 (four) hours as needed for severe pain. What changed:  when to take this  reasons to take this      Follow-up Information    Lisbeth Renshaw, MD. Schedule an appointment as soon as possible for a visit in 3 week(s).   Specialty:  Neurosurgery Contact information: [address] [place] [address] [phone]           Signed: Alyson Ingles 09/01/2018, 7:47 AM

## 2018-09-01 NOTE — Progress Notes (Signed)
Pt doing well. Pt and family given D/C instructions with verbal understanding. Rx's were sent to Pt's pharmacy by MD. Pt's incision is clean and dry with no sign of infection. Pt's IV was removed prior to D/C. Pt D/C'd home via wheelchair @ 1030 per MD order. Pt is stable @ D/C and has no other needs at this time. Rema Fendt, RN

## 2018-09-01 NOTE — Evaluation (Signed)
Occupational Therapy Evaluation Patient Details Name: Keith [name] MRN: [id number] DOB: Oct 16, 1975 Today's Date: 09/01/2018    History of Present Illness Pt is a 43 y.o. M with significant PMH of multiple left L5-S1 microdiskectomies who is s/p posterior lumbar interbody fusion L5-S1   Clinical Impression   PTA, pt was living alone and was independent; planning to dc to father's house. Currently, pt performing ADLs and functional mobility at supervision level. Provided education on LB ADLs, toilet transfer, and tub transfer; pt demonstrated and verbalized understanding. Answered all pt questions. Recommend dc home once medically stable per physician. All acute OT needs met and will sign off. Thank you.     Follow Up Recommendations  No OT follow up;Supervision - Intermittent    Equipment Recommendations  None recommended by OT    Recommendations for Other Services PT consult     Precautions / Restrictions Precautions Precautions: Back Precaution Booklet Issued: Yes (comment) Precaution Comments: Pt able to recall 1/3 precautions.  Required Braces or Orthoses: Spinal Brace Spinal Brace: Applied in sitting position;Lumbar corset Restrictions Weight Bearing Restrictions: No      Mobility Bed Mobility Overal bed mobility: Modified Independent             General bed mobility comments: HOB flat and rails lowered to simulate home environment.   Transfers Overall transfer level: Modified independent Equipment used: None             General transfer comment: Pt demonstrated proper hand placement on seated surface for safety.     Balance Overall balance assessment: Mild deficits observed, not formally tested                                         ADL either performed or assessed with clinical judgement   ADL Overall ADL's : Needs assistance/impaired                                       General ADL Comments: Pt performing  ADLs and functional mobility at supervision. Provided education on back precautions, brace management, LB ADLs, grooming, toileting, and tub transfers.      Vision         Perception     Praxis      Pertinent Vitals/Pain Pain Assessment: Faces Faces Pain Scale: Hurts little more Pain Location: incisional Pain Descriptors / Indicators: Guarding;Discomfort Pain Intervention(s): Monitored during session;Limited activity within patient's tolerance;Repositioned     Hand Dominance Right   Extremity/Trunk Assessment Upper Extremity Assessment Upper Extremity Assessment: Overall WFL for tasks assessed   Lower Extremity Assessment Lower Extremity Assessment: Defer to PT evaluation RLE Deficits / Details: Tight hamstrings LLE Deficits / Details: Tight hamstrings (LLE > RLE)   Cervical / Trunk Assessment Cervical / Trunk Assessment: Other exceptions Cervical / Trunk Exceptions: lumbar fusion   Communication Communication Communication: No difficulties   Cognition Arousal/Alertness: Awake/alert Behavior During Therapy: WFL for tasks assessed/performed Overall Cognitive Status: Within Functional Limits for tasks assessed                                     General Comments       Exercises     Shoulder Instructions  Home Living Family/patient expects to be discharged to:: Private residence Living Arrangements: Parent(father) Available Help at Discharge: Family Type of Home: House Home Access: Stairs to enter Technical brewer of Steps: 1   [place]: One level     Bathroom Shower/Tub: Teacher, early years/pre: Handicapped height     Home Equipment: Crutches;Shower seat;Toilet riser          Prior Functioning/Environment Level of Independence: Independent        Comments: On disability from work, reports he does not exercise        OT Problem List: Decreased range of motion;Decreased activity tolerance;Impaired  balance (sitting and/or standing);Decreased knowledge of use of DME or AE;Decreased knowledge of precautions;Pain      OT Treatment/Interventions:      OT Goals(Current goals can be found in the care plan section) Acute Rehab OT Goals Patient Stated Goal: Home today OT Goal Formulation: All assessment and education complete, DC therapy  OT Frequency:     Barriers to D/C:            Co-evaluation              AM-PAC OT "6 Clicks" Daily Activity     Outcome Measure Help from another person eating meals?: None Help from another person taking care of personal grooming?: None Help from another person toileting, which includes using toliet, bedpan, or urinal?: None Help from another person bathing (including washing, rinsing, drying)?: None Help from another person to put on and taking off regular upper body clothing?: None Help from another person to put on and taking off regular lower body clothing?: None 6 Click Score: 24   End of Session    Activity Tolerance:   Patient left:    OT Visit Diagnosis: Other abnormalities of gait and mobility (R26.89);Unsteadiness on feet (R26.81);Muscle weakness (generalized) (M62.81);Pain Pain - part of body: (Back)                Time: 1027-2536 OT Time Calculation (min): 13 min Charges:  OT General Charges $OT Visit: 1 Visit OT Evaluation $OT Eval Low Complexity: [place], OTR/L Acute Rehab Pager: [phone] Office: [place] 09/01/2018, 8:21 AM

## 2018-09-20 DIAGNOSIS — M5126 Other intervertebral disc displacement, lumbar region: Secondary | ICD-10-CM | POA: Diagnosis not present

## 2020-10-08 DIAGNOSIS — E7800 Pure hypercholesterolemia, unspecified: Secondary | ICD-10-CM | POA: Diagnosis not present

## 2020-10-17 ENCOUNTER — Ambulatory Visit
Admission: EM | Admit: 2020-10-17 | Discharge: 2020-10-17 | Disposition: A | Discharge status: Home / Self Care | Payer: BC Managed Care – PPO | Attending: Emergency Medicine | Admitting: Emergency Medicine

## 2020-10-17 ENCOUNTER — Other Ambulatory Visit: Payer: Self-pay

## 2020-10-17 DIAGNOSIS — H6001 Abscess of right external ear: Secondary | ICD-10-CM | POA: Diagnosis not present

## 2020-10-17 MED ORDER — IBUPROFEN 800 MG PO TABS: 800.0000 mg | ORAL_TABLET | Freq: Three times a day (TID) | ORAL | 0 refills | Status: DC

## 2020-10-17 MED ORDER — DOXYCYCLINE HYCLATE 100 MG PO CAPS: 100.0000 mg | ORAL_CAPSULE | Freq: Two times a day (BID) | ORAL | 0 refills | Status: AC

## 2020-10-17 NOTE — ED Triage Notes (Signed)
Pt c/o abscess to rt ear lobe since Sunday. States had lots of drainage today. Hx of same 40yrs ago.

## 2020-10-17 NOTE — Discharge Instructions (Addendum)
Please begin doxycycline for 7  days  Ibuprofen and tylenol for pain  Apply warm compresses/hot rags to area with massage to express further drainage especially the first 24-48 hours  Return if symptoms returning or not improving

## 2020-10-18 NOTE — ED Provider Notes (Signed)
[name]    CSN: [id number] Arrival date & time: 10/17/20  1718      History   Chief Complaint Chief Complaint  Patient presents with  . Abscess    HPI Keith [name] is a 45 y.o. male presenting today for evaluation of abscess to right earlobe.  Reports over the past 3 to 4 days has developed increased pain swelling and redness to his right earlobe.  Reports history of abscess in earlobe previously approximately 5 years ago requiring I&D.  He reports that he attempted to drain it at home and does report some pustular drainage, but continues to feel discomfort in need for further drainage.  He denies injury or trauma.  HPI  Past Medical History:  Diagnosis Date  . Arthritis   . Back pain with sciatica 10/04/13   left leg  . Heartburn     Patient Active Problem List   Diagnosis Date Noted  . Lumbar radiculopathy 08/31/2018  . 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 Low back pain 09/24/2017  . Spinal stenosis, lumbar region, with neurogenic claudication 10/12/2013  . Herniated lumbar intervertebral disc 10/12/2013  . SINUSITIS, CHRONIC 11/30/2008    Past Surgical History:  Procedure Laterality Date  . BACK SURGERY     lumbar x 2  . CHOLECYSTECTOMY    . FOOT SURGERY    . HEMI-MICRODISCECTOMY LUMBAR LAMINECTOMY LEVEL 1 Left 10/12/2013   Procedure: REDO HEMI-LAMINECTOMY MICRODISCECTOMY L5-S1 ON LEFT ;  Surgeon: Jacki Cones, MD;  Location: [facility];  Service: Orthopedics;  Laterality: Left;  . KNEE SURGERY         Home Medications    Prior to Admission medications   Medication Sig Start Date End Date Taking? Authorizing Provider  doxycycline (VIBRAMYCIN) 100 MG capsule Take 1 capsule (100 mg total) by mouth 2 (two) times daily for 7 days. 10/17/20 10/24/20 Yes Jasmyne Lodato C, PA-C  ibuprofen (ADVIL) 800 MG tablet Take 1 tablet (800 mg total) by mouth 3 (three) times daily. 10/17/20  Yes Marya Lowden, Junius Creamer, PA-C    Family History History  reviewed. No pertinent family history.  Social History Social History   Tobacco Use  . Smoking status: Current Every Day Smoker    Packs/day: 1.00    Years: 25.00    Pack years: 25.00  . Smokeless tobacco: Never Used  Substance Use Topics  . Alcohol use: Yes    Comment: occ  . Drug use: No     Allergies   Patient has no known allergies.   Review of Systems Review of Systems  Constitutional: Negative for fatigue and fever.  HENT: Positive for ear pain.   Eyes: Negative for redness, itching and visual disturbance.  Respiratory: Negative for shortness of breath.   Cardiovascular: Negative for chest pain and leg swelling.  Gastrointestinal: Negative for nausea and vomiting.  Musculoskeletal: Negative for arthralgias and myalgias.  Skin: Positive for color change. Negative for rash and wound.  Neurological: Negative for dizziness, syncope, weakness, light-headedness and headaches.     Physical Exam Triage Vital Signs ED Triage Vitals  Enc Vitals Group     BP 10/17/20 1754 133/81     Pulse Rate 10/17/20 1754 85     Resp 10/17/20 1754 18     Temp 10/17/20 1754 98 F (36.7 C)     Temp Source 10/17/20 1754 Oral     SpO2 10/17/20 1754 97 %     Weight --  Height --      Head Circumference --      Peak Flow --      Pain Score 10/17/20 1757 5     Pain Loc --      Pain Edu? --      Excl. in GC? --    No data found.  Updated Vital Signs BP 133/81 (BP Location: Left Arm)   Pulse 85   Temp 98 F (36.7 C) (Oral)   Resp 18   SpO2 97%   Visual Acuity Right Eye Distance:   Left Eye Distance:   Bilateral Distance:    Right Eye Near:   Left Eye Near:    Bilateral Near:     Physical Exam Vitals and nursing note reviewed.  Constitutional:      Appearance: He is well-developed.     Comments: No acute distress  HENT:     Head: Normocephalic and atraumatic.     Ears:     Comments: Right earlobe with erythema and do a induration and mild fluctuance, area of  fluctuance noted posteriorly with associated tenderness and erythema, anteriorly erythematous scabbed area with no specific area of fluctuance noted    Nose: Nose normal.  Eyes:     Conjunctiva/sclera: Conjunctivae normal.  Cardiovascular:     Rate and Rhythm: Normal rate.  Pulmonary:     Effort: Pulmonary effort is normal. No respiratory distress.  Abdominal:     General: There is no distension.  Musculoskeletal:        General: Normal range of motion.     Cervical back: Neck supple.  Skin:    General: Skin is warm and dry.  Neurological:     Mental Status: He is alert and oriented to person, place, and time.      UC Treatments / Results  Labs (all labs ordered are listed, but only abnormal results are displayed) Labs Reviewed - No data to display  EKG   Radiology No results found.  Procedures Incision and Drainage  Date/Time: 10/17/2020 8:05 PM Performed by: Meia Emley, [place] C, PA-C Authorized by: Jassiel Flye, Junius Creamer, PA-C   Consent:    Consent obtained:  Verbal   Consent given by:  Patient   Risks, benefits, and alternatives were discussed: yes     Risks discussed:  Bleeding, pain and damage to other organs (scarring, poor cosmetic result)   Alternatives discussed:  No treatment Universal protocol:    Patient identity confirmed:  Verbally with patient Location:    Type:  Abscess   Location: right ear lobe. Pre-procedure details:    Skin preparation:  Povidone-iodine Sedation:    Sedation type:  None Anesthesia:    Anesthesia method:  Local infiltration   Local anesthetic:  Lidocaine 2% w/o epi Procedure type:    Complexity:  Simple Procedure details:    Incision types:  Stab incision   Incision depth:  Subcutaneous   Wound management:  Probed and deloculated   Drainage:  Bloody (small amount of cyst like material)   Drainage amount:  Scant   Wound treatment:  Wound left open   Packing materials:  None Post-procedure details:    Procedure completion:   Tolerated well, no immediate complications Comments:     Initially I&D attempted over posterior area of fluctuance without drainage, with manipulation towards open area and back, increased cystlike material expressed from prior opening anteriorly, extended this slightly to try and obtain further cystlike material without success  Procedure terminated   (including  critical care time)  Medications Ordered in UC Medications - No data to display  Initial Impression / Assessment and Plan / UC Course  I have reviewed the triage vital signs and the nursing notes.  Pertinent labs & imaging results that were available during my care of the patient were reviewed by me and considered in my medical decision making (see chart for details).     I&D attempted without significant drainage, gauze and Band-Aid applied to area, initiating on doxycycline and recommending warm compresses with close monitoring.  Discussed following up with dermatology/surgery if continuing to have recurrent problems or removal of possible underlying cyst.  Discussed strict return precautions. Patient verbalized understanding and is agreeable with plan.  Final Clinical Impressions(s) / UC Diagnoses   Final diagnoses:  Abscess of right earlobe     Discharge Instructions     Please begin doxycycline for 7  days  Ibuprofen and tylenol for pain  Apply warm compresses/hot rags to area with massage to express further drainage especially the first 24-48 hours  Return if symptoms returning or not improving    ED Prescriptions    Medication Sig Dispense Auth. Provider   doxycycline (VIBRAMYCIN) 100 MG capsule Take 1 capsule (100 mg total) by mouth 2 (two) times daily for 7 days. 14 capsule Aily Tzeng C, PA-C   ibuprofen (ADVIL) 800 MG tablet Take 1 tablet (800 mg total) by mouth 3 (three) times daily. 21 tablet Talyssa Gibas, [place] C, PA-C     PDMP not reviewed this encounter.   Lew Dawes, PA-C 10/18/20  1008

## 2020-11-08 DIAGNOSIS — R7309 Other abnormal glucose: Secondary | ICD-10-CM | POA: Diagnosis not present

## 2020-11-08 DIAGNOSIS — E7800 Pure hypercholesterolemia, unspecified: Secondary | ICD-10-CM | POA: Diagnosis not present

## 2020-12-26 ENCOUNTER — Other Ambulatory Visit: Payer: Self-pay

## 2020-12-26 ENCOUNTER — Ambulatory Visit
Admission: EM | Admit: 2020-12-26 | Discharge: 2020-12-26 | Disposition: A | Discharge status: Home / Self Care | Payer: BC Managed Care – PPO | Attending: Emergency Medicine | Admitting: Emergency Medicine

## 2020-12-26 DIAGNOSIS — M545 Low back pain, unspecified: Secondary | ICD-10-CM

## 2020-12-26 MED ORDER — ETODOLAC 400 MG PO TABS: 400.0000 mg | ORAL_TABLET | Freq: Two times a day (BID) | ORAL | 0 refills | Status: AC

## 2020-12-26 MED ORDER — TIZANIDINE HCL 2 MG PO TABS: 2.0000 mg | ORAL_TABLET | Freq: Four times a day (QID) | ORAL | 0 refills | Status: AC | PRN

## 2020-12-26 NOTE — Discharge Instructions (Signed)
Continue etodolac twice daily with food Stable with tizanidine at home/bedtime Gentle stretching Alternate ice and heat Follow-up if not improving or worsening

## 2020-12-26 NOTE — ED Provider Notes (Signed)
[name]    CSN: [id number] Arrival date & time: 12/26/20  1121      History   Chief Complaint Chief Complaint  Patient presents with  . Fall  . Back Pain    HPI Keith [name] is a 45 y.o. male presenting today for evaluation of low back pain.  Reports slipped and fell and landed on back on the side of a boat over the weekend.  Since he has had pain in his lower back with movement.  Denies at rest.  Reports history of similar which will occur intermittently.  Denies radiation into legs.  Denies any urinary symptoms.  Typically will use etodolac and tizanidine with relief.  Has ran out of these medicines.  HPI  Past Medical History:  Diagnosis Date  . Arthritis   . Back pain with sciatica 10/04/13   left leg  . Heartburn     Patient Active Problem List   Diagnosis Date Noted  . Lumbar radiculopathy 08/31/2018  . 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 Low back pain 09/24/2017  . Spinal stenosis, lumbar region, with neurogenic claudication 10/12/2013  . Herniated lumbar intervertebral disc 10/12/2013  . SINUSITIS, CHRONIC 11/30/2008    Past Surgical History:  Procedure Laterality Date  . BACK SURGERY     lumbar x 2  . CHOLECYSTECTOMY    . FOOT SURGERY    . HEMI-MICRODISCECTOMY LUMBAR LAMINECTOMY LEVEL 1 Left 10/12/2013   Procedure: REDO HEMI-LAMINECTOMY MICRODISCECTOMY L5-S1 ON LEFT ;  Surgeon: Jacki Cones, MD;  Location: [facility];  Service: Orthopedics;  Laterality: Left;  . KNEE SURGERY         Home Medications    Prior to Admission medications   Medication Sig Start Date End Date Taking? Authorizing Provider  etodolac (LODINE) 400 MG tablet Take 1 tablet (400 mg total) by mouth 2 (two) times daily. 12/26/20  Yes Karlea Mckibbin C, PA-C  tiZANidine (ZANAFLEX) 2 MG tablet Take 1-2 tablets (2-4 mg total) by mouth every 6 (six) hours as needed for muscle spasms. 12/26/20  Yes Ettore Trebilcock, Junius Creamer, PA-C    Family History History reviewed. No  pertinent family history.  Social History Social History   Tobacco Use  . Smoking status: Current Every Day Smoker    Packs/day: 1.00    Years: 25.00    Pack years: 25.00  . Smokeless tobacco: Never Used  Substance Use Topics  . Alcohol use: Yes    Comment: occ  . Drug use: No     Allergies   Patient has no known allergies.   Review of Systems Review of Systems  Constitutional: Negative for fatigue and fever.  Eyes: Negative for redness, itching and visual disturbance.  Respiratory: Negative for shortness of breath.   Cardiovascular: Negative for chest pain and leg swelling.  Gastrointestinal: Negative for nausea and vomiting.  Musculoskeletal: Positive for back pain and myalgias. Negative for arthralgias.  Skin: Negative for color change, rash and wound.  Neurological: Negative for dizziness, syncope, weakness, light-headedness and headaches.     Physical Exam Triage Vital Signs ED Triage Vitals  Enc Vitals Group     BP 12/26/20 1216 138/86     Pulse Rate 12/26/20 1216 68     Resp 12/26/20 1216 16     Temp 12/26/20 1216 (!) 97.5 F (36.4 C)     Temp Source 12/26/20 1216 Oral     SpO2 12/26/20 1216 96 %     Weight --  Height --      Head Circumference --      Peak Flow --      Pain Score 12/26/20 1214 3     Pain Loc --      Pain Edu? --      Excl. in [place]? --    No data found.  Updated Vital Signs BP 138/86 (BP Location: Left Arm)   Pulse 68   Temp (!) 97.5 F (36.4 C) (Oral)   Resp 16   SpO2 96%   Visual Acuity Right Eye Distance:   Left Eye Distance:   Bilateral Distance:    Right Eye Near:   Left Eye Near:    Bilateral Near:     Physical Exam Vitals and nursing note reviewed.  Constitutional:      Appearance: He is well-developed.     Comments: No acute distress  HENT:     Head: Normocephalic and atraumatic.     Nose: Nose normal.  Eyes:     Conjunctiva/sclera: Conjunctivae normal.  Cardiovascular:     Rate and Rhythm: Normal  rate.  Pulmonary:     Effort: Pulmonary effort is normal. No respiratory distress.  Abdominal:     General: There is no distension.  Musculoskeletal:        General: Normal range of motion.     Cervical back: Neck supple.     Comments: Back: Well-healed surgical scar midline upper lumbar area, no lumbar tenderness to palpation, no palpable deformity or step-off, no reproducible tenderness to palpation of bilateral lumbar musculature  Strength at hips and knees 5/5 and equal bilaterally  Skin:    General: Skin is warm and dry.  Neurological:     Mental Status: He is alert and oriented to person, place, and time.      UC Treatments / Results  Labs (all labs ordered are listed, but only abnormal results are displayed) Labs Reviewed - No data to display  EKG   Radiology No results found.  Procedures Procedures (including critical care time)  Medications Ordered in UC Medications - No data to display  Initial Impression / Assessment and Plan / UC Course  I have reviewed the triage vital signs and the nursing notes.  Pertinent labs & imaging results that were available during my care of the patient were reviewed by me and considered in my medical decision making (see chart for details).     Low back pain-no midline tenderness, similar to prior low back pain flares, will defer imaging today.  Refilling etodolac and tizanidine as this is helped him previously.  Discussed activity modification, work note provided.  Discussed strict return precautions. Patient verbalized understanding and is agreeable with plan.  Final Clinical Impressions(s) / UC Diagnoses   Final diagnoses:  Acute bilateral low back pain without sciatica     Discharge Instructions     Continue etodolac twice daily with food Stable with tizanidine at home/bedtime Gentle stretching Alternate ice and heat Follow-up if not improving or worsening    ED Prescriptions    Medication Sig Dispense Auth.  Provider   tiZANidine (ZANAFLEX) 2 MG tablet Take 1-2 tablets (2-4 mg total) by mouth every 6 (six) hours as needed for muscle spasms. 30 tablet Essa Wenk C, PA-C   etodolac (LODINE) 400 MG tablet Take 1 tablet (400 mg total) by mouth 2 (two) times daily. 30 tablet [place] Sherr, [place] C, PA-C     PDMP not reviewed this encounter.   Lynford Espinoza, [place] C,  PA-C 12/26/20 1325

## 2020-12-26 NOTE — ED Triage Notes (Signed)
Patient presents to Urgent Care with complaints of a fall saturday and has had lower back pain since fall. He reports he has a hx of back issues and so with bending or twisting movements cause flare ups. Treating with etodolac and tizanidine with relief.

## 2021-01-15 DIAGNOSIS — E7800 Pure hypercholesterolemia, unspecified: Secondary | ICD-10-CM | POA: Diagnosis not present

## 2021-04-07 DIAGNOSIS — H6690 Otitis media, unspecified, unspecified ear: Secondary | ICD-10-CM | POA: Diagnosis not present

## 2021-04-07 DIAGNOSIS — B354 Tinea corporis: Secondary | ICD-10-CM | POA: Diagnosis not present

## 2021-05-28 ENCOUNTER — Other Ambulatory Visit: Payer: Self-pay | Admitting: Family Medicine

## 2021-05-28 ENCOUNTER — Ambulatory Visit
Admission: RE | Admit: 2021-05-28 | Discharge: 2021-05-28 | Disposition: A | Discharge status: Home / Self Care | Payer: BC Managed Care – PPO | Source: Ambulatory Visit | Attending: Family Medicine | Admitting: Family Medicine

## 2021-05-28 DIAGNOSIS — J069 Acute upper respiratory infection, unspecified: Secondary | ICD-10-CM

## 2021-06-07 DIAGNOSIS — R252 Cramp and spasm: Secondary | ICD-10-CM | POA: Diagnosis not present

## 2021-06-07 DIAGNOSIS — E1169 Type 2 diabetes mellitus with other specified complication: Secondary | ICD-10-CM | POA: Diagnosis not present

## 2021-06-07 DIAGNOSIS — E7800 Pure hypercholesterolemia, unspecified: Secondary | ICD-10-CM | POA: Diagnosis not present

## 2021-08-08 DIAGNOSIS — U071 COVID-19: Secondary | ICD-10-CM | POA: Diagnosis not present

## 2021-10-17 DIAGNOSIS — E1169 Type 2 diabetes mellitus with other specified complication: Secondary | ICD-10-CM | POA: Diagnosis not present

## 2021-12-11 DIAGNOSIS — E1169 Type 2 diabetes mellitus with other specified complication: Secondary | ICD-10-CM | POA: Diagnosis not present

## 2021-12-11 DIAGNOSIS — E7800 Pure hypercholesterolemia, unspecified: Secondary | ICD-10-CM | POA: Diagnosis not present

## 2021-12-11 DIAGNOSIS — Z0000 Encounter for general adult medical examination without abnormal findings: Secondary | ICD-10-CM | POA: Diagnosis not present

## 2021-12-11 DIAGNOSIS — R197 Diarrhea, unspecified: Secondary | ICD-10-CM | POA: Diagnosis not present

## 2021-12-23 DIAGNOSIS — Z1211 Encounter for screening for malignant neoplasm of colon: Secondary | ICD-10-CM | POA: Diagnosis not present

## 2022-03-14 DIAGNOSIS — H9201 Otalgia, right ear: Secondary | ICD-10-CM | POA: Diagnosis not present

## 2022-03-18 DIAGNOSIS — H9201 Otalgia, right ear: Secondary | ICD-10-CM | POA: Diagnosis not present

## 2022-03-18 DIAGNOSIS — H6090 Unspecified otitis externa, unspecified ear: Secondary | ICD-10-CM | POA: Diagnosis not present

## 2022-04-09 DIAGNOSIS — M25511 Pain in right shoulder: Secondary | ICD-10-CM | POA: Diagnosis not present

## 2022-04-09 DIAGNOSIS — R252 Cramp and spasm: Secondary | ICD-10-CM | POA: Diagnosis not present

## 2022-04-23 DIAGNOSIS — M25511 Pain in right shoulder: Secondary | ICD-10-CM | POA: Diagnosis not present

## 2022-05-07 DIAGNOSIS — M25511 Pain in right shoulder: Secondary | ICD-10-CM | POA: Diagnosis not present

## 2022-05-14 DIAGNOSIS — M75111 Incomplete rotator cuff tear or rupture of right shoulder, not specified as traumatic: Secondary | ICD-10-CM | POA: Diagnosis not present

## 2022-05-14 DIAGNOSIS — S43431A Superior glenoid labrum lesion of right shoulder, initial encounter: Secondary | ICD-10-CM | POA: Diagnosis not present

## 2022-05-16 ENCOUNTER — Ambulatory Visit
Admission: EM | Admit: 2022-05-16 | Discharge: 2022-05-16 | Disposition: A | Discharge status: Home / Self Care | Payer: BC Managed Care – PPO | Attending: Physician Assistant | Admitting: Physician Assistant

## 2022-05-16 ENCOUNTER — Encounter: Payer: Self-pay | Admitting: Emergency Medicine

## 2022-05-16 DIAGNOSIS — Z1152 Encounter for screening for COVID-19: Secondary | ICD-10-CM | POA: Diagnosis not present

## 2022-05-16 DIAGNOSIS — J069 Acute upper respiratory infection, unspecified: Secondary | ICD-10-CM | POA: Diagnosis not present

## 2022-05-16 LAB — SARS CORONAVIRUS 2 (TAT 6-24 HRS): SARS Coronavirus 2: NEGATIVE

## 2022-05-16 NOTE — ED Triage Notes (Signed)
Pt is present today with c/o cough and chest congestion. Pt sx started last night

## 2022-05-16 NOTE — ED Provider Notes (Signed)
[facility]    CSN: [id number] Arrival date & time: 05/16/22  1048      History   Chief Complaint Chief Complaint  Patient presents with   Cough    Chest congestion    HPI Keith [name] is a 46 y.o. male.   Patient here today for evaluation of cough congestion that started last night.  He has not had significant sore throat.  He denies any fever.  He has not had any nausea, vomiting but does endorse some diarrhea.  He reports multiple of his coworkers are sick with similar symptoms.  He does not report any treatment for symptoms.  The history is provided by the patient.  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   Low back pain 09/24/2017   Spinal stenosis, lumbar region, with neurogenic claudication 10/12/2013   Herniated lumbar intervertebral disc 10/12/2013   SINUSITIS, CHRONIC 11/30/2008    Past Surgical History:  Procedure Laterality Date   BACK SURGERY     lumbar x 2   CHOLECYSTECTOMY     FOOT SURGERY     HEMI-MICRODISCECTOMY LUMBAR LAMINECTOMY LEVEL 1 Left 10/12/2013   Procedure: REDO HEMI-LAMINECTOMY MICRODISCECTOMY L5-S1 ON LEFT ;  Surgeon: Tobi Bastos, MD;  Location: [facility];  Service: Orthopedics;  Laterality: Left;   KNEE SURGERY         Home Medications    Prior to Admission medications   Medication Sig Start Date End Date Taking? Authorizing Provider  etodolac (LODINE) 400 MG tablet Take 1 tablet (400 mg total) by mouth 2 (two) times daily. 12/26/20   Wieters, Hallie C, PA-C  tiZANidine (ZANAFLEX) 2 MG tablet Take 1-2 tablets (2-4 mg total) by mouth every 6 (six) hours as needed for muscle spasms. 12/26/20    Wieters, Elesa Hacker, PA-C    Family History History reviewed. No pertinent family history.  Social History Social History   Tobacco Use   Smoking status: Every Day    Packs/day: 1.00    Years: 25.00    Total pack years: 25.00    Types: Cigarettes   Smokeless tobacco: Never  Substance Use Topics   Alcohol use: Yes    Comment: occ   Drug use: No     Allergies   Patient has no known allergies.   Review of Systems Review of Systems  Constitutional:  Negative for chills and fever.  HENT:  Positive for congestion. Negative for ear pain and sore throat.   Eyes:  Negative for discharge and redness.  Respiratory:  Positive for cough. Negative for shortness of breath.   Gastrointestinal:  Negative for abdominal pain, nausea and vomiting.     Physical Exam Triage Vital Signs ED Triage Vitals [05/16/22 1225]  Enc Vitals Group     BP (!) 147/94     Pulse Rate 84     Resp 18     Temp 97.8 F (36.6 C)     Temp src      SpO2 95 %     Weight      Height  Head Circumference      Peak Flow      Pain Score 0     Pain Loc      Pain Edu?      Excl. in [place]?    No data found.  Updated Vital Signs BP (!) 147/94   Pulse 84   Temp 97.8 F (36.6 C)   Resp 18   SpO2 95%      Physical Exam Vitals and nursing note reviewed.  Constitutional:      General: He is not in acute distress.    Appearance: Normal appearance. He is not ill-appearing.  HENT:     Head: Normocephalic and atraumatic.     Nose: Congestion (mild) present.     Mouth/Throat:     Mouth: Mucous membranes are moist.     Pharynx: Oropharynx is clear. No oropharyngeal exudate or posterior oropharyngeal erythema.  Eyes:     Conjunctiva/sclera: Conjunctivae normal.  Cardiovascular:     Rate and Rhythm: Normal rate and regular rhythm.     Heart sounds: Normal heart sounds. No murmur heard. Pulmonary:     Effort: Pulmonary effort is normal. No respiratory distress.     Breath sounds: Normal breath  sounds. No wheezing, rhonchi or rales.  Skin:    General: Skin is warm and dry.  Neurological:     Mental Status: He is alert.  Psychiatric:        Mood and Affect: Mood normal.        Thought Content: Thought content normal.      UC Treatments / Results  Labs (all labs ordered are listed, but only abnormal results are displayed) Labs Reviewed  SARS CORONAVIRUS 2 (TAT 6-24 HRS)    EKG   Radiology No results found.  Procedures Procedures (including critical care time)  Medications Ordered in UC Medications - No data to display  Initial Impression / Assessment and Plan / UC Course  I have reviewed the triage vital signs and the nursing notes.  Pertinent labs & imaging results that were available during my care of the patient were reviewed by me and considered in my medical decision making (see chart for details).    Suspect viral etiology of symptoms.  Recommend symptomatic treatment and follow-up with any further concerns.  COVID screening ordered.  Final Clinical Impressions(s) / UC Diagnoses   Final diagnoses:  Acute upper respiratory infection  Encounter for screening for COVID-19   Discharge Instructions   None    ED Prescriptions   None    PDMP not reviewed this encounter.   Francene Finders, PA-C 05/16/22 1253

## 2022-05-19 DIAGNOSIS — J209 Acute bronchitis, unspecified: Secondary | ICD-10-CM | POA: Diagnosis not present

## 2022-05-22 ENCOUNTER — Emergency Department: Payer: BC Managed Care – PPO

## 2022-05-22 ENCOUNTER — Other Ambulatory Visit: Payer: Self-pay

## 2022-05-22 ENCOUNTER — Emergency Department
Admission: EM | Admit: 2022-05-22 | Discharge: 2022-05-22 | Disposition: A | Discharge status: Home / Self Care | Payer: BC Managed Care – PPO | Attending: Emergency Medicine | Admitting: Emergency Medicine

## 2022-05-22 DIAGNOSIS — E1165 Type 2 diabetes mellitus with hyperglycemia: Secondary | ICD-10-CM | POA: Diagnosis not present

## 2022-05-22 DIAGNOSIS — R1084 Generalized abdominal pain: Secondary | ICD-10-CM | POA: Diagnosis not present

## 2022-05-22 DIAGNOSIS — R062 Wheezing: Secondary | ICD-10-CM | POA: Diagnosis not present

## 2022-05-22 DIAGNOSIS — R739 Hyperglycemia, unspecified: Secondary | ICD-10-CM | POA: Diagnosis not present

## 2022-05-22 DIAGNOSIS — R109 Unspecified abdominal pain: Secondary | ICD-10-CM | POA: Diagnosis not present

## 2022-05-22 DIAGNOSIS — I700 Atherosclerosis of aorta: Secondary | ICD-10-CM | POA: Diagnosis not present

## 2022-05-22 DIAGNOSIS — R112 Nausea with vomiting, unspecified: Secondary | ICD-10-CM | POA: Diagnosis not present

## 2022-05-22 DIAGNOSIS — Z7984 Long term (current) use of oral hypoglycemic drugs: Secondary | ICD-10-CM | POA: Insufficient documentation

## 2022-05-22 DIAGNOSIS — R197 Diarrhea, unspecified: Secondary | ICD-10-CM

## 2022-05-22 DIAGNOSIS — R059 Cough, unspecified: Secondary | ICD-10-CM | POA: Diagnosis not present

## 2022-05-22 LAB — URINALYSIS, ROUTINE W REFLEX MICROSCOPIC
Bacteria, UA: NONE SEEN
Bilirubin Urine: NEGATIVE
Glucose, UA: >=500 mg/dL — AB
Hgb urine dipstick: NEGATIVE
Ketones, ur: 80 mg/dL — AB
Leukocytes,Ua: NEGATIVE
Nitrite: NEGATIVE
Protein, ur: NEGATIVE mg/dL
Specific Gravity, Urine: 1.031 — ABNORMAL HIGH (ref 1.005–1.030)
Squamous Epithelial / HPF: NONE SEEN (ref 0–5)
pH: 6 (ref 5.0–8.0)

## 2022-05-22 LAB — BLOOD GAS, VENOUS
Acid-Base Excess: 4.8 mmol/L — ABNORMAL HIGH (ref 0.0–2.0)
Bicarbonate: 29.9 mmol/L — ABNORMAL HIGH (ref 20.0–28.0)
O2 Saturation: 84.2 %
Patient temperature: 37
pCO2, Ven: 45 mmHg (ref 44–60)
pH, Ven: 7.43 (ref 7.25–7.43)
pO2, Ven: 53 mmHg — ABNORMAL HIGH (ref 32–45)

## 2022-05-22 LAB — CBC
HCT: 51.8 % (ref 39.0–52.0)
Hemoglobin: 17.8 g/dL — ABNORMAL HIGH (ref 13.0–17.0)
MCH: 32.1 pg (ref 26.0–34.0)
MCHC: 34.4 g/dL (ref 30.0–36.0)
MCV: 93.3 fL (ref 80.0–100.0)
Platelets: 350 10*3/uL (ref 150–400)
RBC: 5.55 MIL/uL (ref 4.22–5.81)
RDW: 11.8 % (ref 11.5–15.5)
WBC: 13.4 10*3/uL — ABNORMAL HIGH (ref 4.0–10.5)
nRBC: 0 % (ref 0.0–0.2)

## 2022-05-22 LAB — COMPREHENSIVE METABOLIC PANEL
ALT: 67 U/L — ABNORMAL HIGH (ref 0–44)
AST: 36 U/L (ref 15–41)
Albumin: 4.9 g/dL (ref 3.5–5.0)
Alkaline Phosphatase: 134 U/L — ABNORMAL HIGH (ref 38–126)
Anion gap: 19 — ABNORMAL HIGH (ref 5–15)
BUN: 25 mg/dL — ABNORMAL HIGH (ref 6–20)
CO2: 24 mmol/L (ref 22–32)
Calcium: 10.3 mg/dL (ref 8.9–10.3)
Chloride: 91 mmol/L — ABNORMAL LOW (ref 98–111)
Creatinine, Ser: 1.34 mg/dL — ABNORMAL HIGH (ref 0.61–1.24)
GFR, Estimated: >60 mL/min (ref >60)
Glucose, Bld: 618 mg/dL (ref 70–99)
Potassium: 5.2 mmol/L — ABNORMAL HIGH (ref 3.5–5.1)
Sodium: 134 mmol/L — ABNORMAL LOW (ref 135–145)
Total Bilirubin: 1.6 mg/dL — ABNORMAL HIGH (ref 0.3–1.2)
Total Protein: 8.9 g/dL — ABNORMAL HIGH (ref 6.5–8.1)

## 2022-05-22 LAB — LIPASE, BLOOD: Lipase: 34 U/L (ref 11–51)

## 2022-05-22 LAB — CBG MONITORING, ED
Glucose-Capillary: 462 mg/dL — ABNORMAL HIGH (ref 70–99)
Glucose-Capillary: 444 mg/dL — ABNORMAL HIGH (ref 70–99)
Glucose-Capillary: 366 mg/dL — ABNORMAL HIGH (ref 70–99)

## 2022-05-22 LAB — BETA-HYDROXYBUTYRIC ACID: Beta-Hydroxybutyric Acid: 3.14 mmol/L — ABNORMAL HIGH (ref 0.05–0.27)

## 2022-05-22 MED ORDER — SODIUM CHLORIDE 0.9 % IV BOLUS
1000.0000 mL | Freq: Once | INTRAVENOUS | Status: AC
  Administered 2022-05-22: 1000 mL via INTRAVENOUS

## 2022-05-22 MED ORDER — MORPHINE SULFATE (PF) 4 MG/ML IV SOLN
4.0000 mg | Freq: Once | INTRAVENOUS | Status: AC
Start: 2022-05-22 — End: 2022-05-22
  Administered 2022-05-22: 4 mg via INTRAVENOUS
  Filled 2022-05-22: qty 1

## 2022-05-22 MED ORDER — IOHEXOL 300 MG/ML  SOLN
100.0000 mL | Freq: Once | INTRAMUSCULAR | Status: AC | PRN
  Administered 2022-05-22: 100 mL via INTRAVENOUS

## 2022-05-22 MED ORDER — METFORMIN HCL 1000 MG PO TABS: 1000.0000 mg | ORAL_TABLET | Freq: Two times a day (BID) | ORAL | 1 refills | Status: AC

## 2022-05-22 MED ORDER — SODIUM CHLORIDE 0.9 % IV SOLN: Freq: Once | INTRAVENOUS | Status: AC

## 2022-05-22 MED ORDER — GLIMEPIRIDE 2 MG PO TABS: 2.0000 mg | ORAL_TABLET | ORAL | 11 refills | Status: AC

## 2022-05-22 MED ORDER — INSULIN ASPART 100 UNIT/ML IJ SOLN
5.0000 [IU] | Freq: Once | INTRAMUSCULAR | Status: AC
  Administered 2022-05-22: 5 [IU] via INTRAVENOUS
  Filled 2022-05-22: qty 1

## 2022-05-22 MED ORDER — INSULIN ASPART 100 UNIT/ML IJ SOLN
3.0000 [IU] | Freq: Once | INTRAMUSCULAR | Status: AC
Start: 2022-05-22 — End: 2022-05-22
  Administered 2022-05-22: 3 [IU] via INTRAVENOUS
  Filled 2022-05-22: qty 1

## 2022-05-22 MED ORDER — ONDANSETRON HCL 4 MG/2ML IJ SOLN
4.0000 mg | Freq: Once | INTRAMUSCULAR | Status: AC | PRN
  Administered 2022-05-22: 4 mg via INTRAVENOUS
  Filled 2022-05-22: qty 2

## 2022-05-22 NOTE — ED Provider Notes (Signed)
[facility] Provider Note    Event Date/Time   First MD Initiated Contact with Patient 05/22/22 [phone]     (approximate)   History   Abdominal Pain   HPI  Keith [name] is a 46 y.o. male who complains of nausea vomiting diarrhea starting on Monday.  He has not had any diarrhea for the last couple days.  The pain is crampy and currently diffuse.  He does not feel well at all.  He takes metformin for diabetes but no other medications he says.      Physical Exam   Triage Vital Signs: ED Triage Vitals  Enc Vitals Group     BP 05/22/22 0624 128/85     Pulse Rate 05/22/22 0624 94     Resp 05/22/22 0624 (!) 21     Temp 05/22/22 0624 98 F (36.7 C)     Temp Source 05/22/22 0624 Oral     SpO2 05/22/22 0624 95 %     Weight 05/22/22 0622 250 lb (113.4 kg)     Height 05/22/22 0622 5\' 10"  (1.778 m)     Head Circumference --      Peak Flow --      Pain Score 05/22/22 0622 8     Pain Loc --      Pain Edu? --      Excl. in [place]? --     Most recent vital signs: Vitals:   05/22/22 1200 05/22/22 1226  BP: (!) 161/96   Pulse: 85   Resp: 17   Temp:  98.5 F (36.9 C)  SpO2: 93%      General: Awake, alert looks uncomfortable CV:  Good peripheral perfusion.  Heart regular rate and rhythm no audible murmurs Resp:  Normal effort.  Lungs are clear Abd:  No distention.    Labs (all labs ordered are listed, but only abnormal results are displayed) Labs Reviewed  COMPREHENSIVE METABOLIC PANEL - Abnormal; Notable for the following components:      Result Value   Sodium 134 (*)    Potassium 5.2 (*)    Chloride 91 (*)    Glucose, Bld 618 (*)    BUN 25 (*)    Creatinine, Ser 1.34 (*)    Total Protein 8.9 (*)    ALT 67 (*)    Alkaline Phosphatase 134 (*)    Total Bilirubin 1.6 (*)    Anion gap 19 (*)    All other components within normal limits  CBC - Abnormal; Notable for the following components:   WBC 13.4 (*)    Hemoglobin 17.8 (*)    All  other components within normal limits  URINALYSIS, ROUTINE W REFLEX MICROSCOPIC - Abnormal; Notable for the following components:   Color, Urine STRAW (*)    APPearance CLEAR (*)    Specific Gravity, Urine 1.031 (*)    Glucose, UA >=500 (*)    Ketones, ur 80 (*)    All other components within normal limits  BLOOD GAS, VENOUS - Abnormal; Notable for the following components:   pO2, Ven 53 (*)    Bicarbonate 29.9 (*)    Acid-Base Excess 4.8 (*)    All other components within normal limits  BETA-HYDROXYBUTYRIC ACID - Abnormal; Notable for the following components:   Beta-Hydroxybutyric Acid 3.14 (*)    All other components within normal limits  CBG MONITORING, ED - Abnormal; Notable for the following components:   Glucose-Capillary 462 (*)    All other  components within normal limits  CBG MONITORING, ED - Abnormal; Notable for the following components:   Glucose-Capillary 444 (*)    All other components within normal limits  CBG MONITORING, ED - Abnormal; Notable for the following components:   Glucose-Capillary 366 (*)    All other components within normal limits  LIPASE, BLOOD     EKG  EKG read and interpreted by me shows normal sinus rhythm rate of 96 normal axis no acute ST-T wave changes   RADIOLOGY    PROCEDURES:  Critical Care performed:   Procedures   MEDICATIONS ORDERED IN ED: Medications  ondansetron (ZOFRAN) injection 4 mg (4 mg Intravenous Given 05/22/22 0748)  sodium chloride 0.9 % bolus 1,000 mL (0 mLs Intravenous Stopped 05/22/22 0955)  morphine (PF) 4 MG/ML injection 4 mg (4 mg Intravenous Given 05/22/22 0742)  iohexol (OMNIPAQUE) 300 MG/ML solution 100 mL (100 mLs Intravenous Contrast Given 05/22/22 0832)  insulin aspart (novoLOG) injection 3 Units (3 Units Intravenous Given 05/22/22 1008)  sodium chloride 0.9 % bolus 1,000 mL (0 mLs Intravenous Stopped 05/22/22 1111)  insulin aspart (novoLOG) injection 5 Units (5 Units Intravenous Given 05/22/22  1148)  0.9 %  sodium chloride infusion (0 mLs Intravenous Stopped 05/22/22 1227)     IMPRESSION / MDM / ASSESSMENT AND PLAN / ED COURSE  I reviewed the triage vital signs and the nursing notes. Patient sugar comes down with fluids and insulin.  His belly pain resolves he is able to take p.o.  Diabetic teaching nurse sees him suggest increasing metformin to thousand twice daily with 2 of Amaryl a day.  I discussed that with the patient and provided him his prescriptions.  He will check his fingersticks at least twice a day for the next 3 to 4 days to make sure he is doing well and his sugar is not going too high or too low.  I provided him instructions on that as well. Patient appears to have had a viral infection which caused him to become hypoglycemic.  He is stable now we will let him go.  Patient's presentation is most consistent with acute presentation with potential threat to life or bodily function.  The patient is on the cardiac monitor to evaluate for evidence of arrhythmia and/or significant heart rate changes.  None were seen  Admission was contemplated for this gentleman but it did not appear that he needed to be admitted.  He was able to eat and drink his belly pain resolved he had not had any diarrhea his sugar was down he was not acidotic and looked well.  He felt he could manage himself at home without difficulty.  Will return if she has any further problems.  FINAL CLINICAL IMPRESSION(S) / ED DIAGNOSES   Final diagnoses:  Hyperglycemia  Abdominal pain, unspecified abdominal location  Nausea vomiting and diarrhea     Rx / DC Orders   ED Discharge Orders          Ordered    glimepiride (AMARYL) 2 MG tablet  BH-each morning        05/22/22 1220    metFORMIN (GLUCOPHAGE) 1000 MG tablet  2 times daily with meals        05/22/22 1220             Note:  This document was prepared using Dragon voice recognition software and may include unintentional dictation  errors.   Nena Polio, MD 05/22/22 [phone]

## 2022-05-22 NOTE — Discharge Instructions (Addendum)
The CT scan did not show any problems.  It is likely you had a stomach bug and then your sugar got high and made your belly pain even worse.    The diabetic nurse thinks that it would be a good idea for you to increase your metformin to 1000 mg twice daily and try Amaryl 2 mg a day.  I agree.  Be careful sometimes Amaryl make your blood sugar go down too low.  Remember to always have some sugar with you to eat if you get shaky or sweaty or if your heart starts racing like you would if your sugar was low.  I would check your fingersticks at least twice a day for the next couple days and make sure that the sugar is coming down and staying down.  Please return here if you have trouble keeping the medicine or food or water down or if your sugar goes over 400 or higher again.  Follow-up with your doctor within a week.

## 2022-05-22 NOTE — ED Triage Notes (Signed)
Abd pain with n/v x 3 days. Pt reports being treated for upper respiratory infection. Wheezing noted in lower lobes bilaterally. Reports using CPAP at home. Pt bgl 332 en route. Pt type 2 DM and unknown baseline bgl. Pt denies insulin use at home but states takes metformin. Pt denies diarrhea currently but reports diarrhea on Monday.

## 2022-05-22 NOTE — Inpatient Diabetes Management (Signed)
Inpatient Diabetes Program Recommendations  AACE/ADA: New Consensus Statement on Inpatient Glycemic Control   Target Ranges:  Prepandial:   less than 140 mg/dL      Peak postprandial:   less than 180 mg/dL (1-2 hours)      Critically ill patients:  140 - 180 mg/dL    Latest Reference Range & Units 05/22/22 09:18 05/22/22 11:12  Glucose-Capillary 70 - 99 mg/dL 462 (H) 444 (H)    Latest Reference Range & Units 05/22/22 06:37  CO2 22 - 32 mmol/L 24  Glucose 70 - 99 mg/dL 618 (HH)  BUN 6 - 20 mg/dL 25 (H)  Creatinine 0.61 - 1.24 mg/dL 1.34 (H)  Calcium 8.9 - 10.3 mg/dL 10.3  Anion gap 5 - 15  19 (H)    Latest Reference Range & Units 05/22/22 07:35  Beta-Hydroxybutyric Acid 0.05 - 0.27 mmol/L 3.14 (H)   Review of Glycemic Control  Diabetes history: DM2 Outpatient Diabetes medications: Metformin 500 mg BID Current orders for Inpatient glycemic control: None; in ED  Inpatient Diabetes Program Recommendations:    Outpatient DM medications: If appropriate, at discharge would recommend to increase Metformin to 1000 mg BID and also add Amaryl 2 mg daily; encourage patient to follow up with PCP within the next week.  NOTE: Patient presented to the ED today with complaints of abdominal pain, N/V x 3 days and lab glucose 618 mg/dl on 05/22/22@6 :37 am today. Spoke with patient at bedside and he reports that he has DM2 hx and takes Metformin BID (was not sure of dose; called [facility] and informed patient is prescribed Metformin 500 mg 2 tabs once a day).  Patient reports that he tolerates the Metformin without any issues but he does note that he has not taken the Metformin in several days because he has been sick and not able to keep anything down. Patient reports that he has not tolerated much PO intake at all over the past 3 days. Anticipate beta-hydroxybutyric acid elevated due to poor PO intake and N/V.  Patient reports that he went to urgent care on 05/16/22 and was given a steroid  injection and antibiotics. Patient states that he checks his glucose at home but not as often as he should; reports CBGs 175-400's mg/dl at home. Patient reports that his last A1C was in 9% range. Patient states that he took Jardiance in the past but experienced side effects so it was stopped. Patient reports he has never been on any other DM medications in the past. Discussed glucose and A1C goals and explained that he needs to be on additional DM medications to get DM under better control. Stressed importance of improving DM control to decrease risk of developing complications from uncontrolled DM.  Asked patient to start checking glucose at least 2 times per day and to call PCP and get follow up appointment within the next week regarding DM management. Patient reports that he has plenty of testing supplies for glucose monitoring. Patient verbalized understanding of information and has no questions at this time.  Thanks, Keith Alderman, RN, MSN, [facility] Coordinator Inpatient Diabetes Program [phone] (Team Pager from 8am to [place])

## 2022-05-26 DIAGNOSIS — R682 Dry mouth, unspecified: Secondary | ICD-10-CM | POA: Diagnosis not present

## 2022-05-26 DIAGNOSIS — E1169 Type 2 diabetes mellitus with other specified complication: Secondary | ICD-10-CM | POA: Diagnosis not present

## 2022-05-26 DIAGNOSIS — E669 Obesity, unspecified: Secondary | ICD-10-CM | POA: Diagnosis not present

## 2022-05-26 DIAGNOSIS — R112 Nausea with vomiting, unspecified: Secondary | ICD-10-CM | POA: Diagnosis not present

## 2022-06-05 DIAGNOSIS — R5383 Other fatigue: Secondary | ICD-10-CM | POA: Diagnosis not present

## 2022-06-05 DIAGNOSIS — E1165 Type 2 diabetes mellitus with hyperglycemia: Secondary | ICD-10-CM | POA: Diagnosis not present

## 2022-06-05 DIAGNOSIS — E782 Mixed hyperlipidemia: Secondary | ICD-10-CM | POA: Diagnosis not present

## 2022-09-03 DIAGNOSIS — F17200 Nicotine dependence, unspecified, uncomplicated: Secondary | ICD-10-CM | POA: Diagnosis not present

## 2022-09-03 DIAGNOSIS — E782 Mixed hyperlipidemia: Secondary | ICD-10-CM | POA: Diagnosis not present

## 2022-09-03 DIAGNOSIS — E1165 Type 2 diabetes mellitus with hyperglycemia: Secondary | ICD-10-CM | POA: Diagnosis not present

## 2022-09-03 DIAGNOSIS — E559 Vitamin D deficiency, unspecified: Secondary | ICD-10-CM | POA: Diagnosis not present

## 2022-12-04 DIAGNOSIS — Z77098 Contact with and (suspected) exposure to other hazardous, chiefly nonmedicinal, chemicals: Secondary | ICD-10-CM | POA: Diagnosis not present

## 2022-12-04 DIAGNOSIS — E1169 Type 2 diabetes mellitus with other specified complication: Secondary | ICD-10-CM | POA: Diagnosis not present

## 2022-12-04 DIAGNOSIS — L739 Follicular disorder, unspecified: Secondary | ICD-10-CM | POA: Diagnosis not present

## 2022-12-04 DIAGNOSIS — E7800 Pure hypercholesterolemia, unspecified: Secondary | ICD-10-CM | POA: Diagnosis not present

## 2022-12-04 DIAGNOSIS — Z0000 Encounter for general adult medical examination without abnormal findings: Secondary | ICD-10-CM | POA: Diagnosis not present

## 2022-12-17 ENCOUNTER — Ambulatory Visit
Admission: EM | Admit: 2022-12-17 | Discharge: 2022-12-17 | Disposition: A | Discharge status: Home / Self Care | Payer: BC Managed Care – PPO

## 2022-12-17 DIAGNOSIS — K068 Other specified disorders of gingiva and edentulous alveolar ridge: Secondary | ICD-10-CM | POA: Diagnosis not present

## 2022-12-17 DIAGNOSIS — R197 Diarrhea, unspecified: Secondary | ICD-10-CM | POA: Diagnosis not present

## 2022-12-17 DIAGNOSIS — R1084 Generalized abdominal pain: Secondary | ICD-10-CM

## 2022-12-17 HISTORY — DX: Type 2 diabetes mellitus without complications: E11.9

## 2022-12-17 HISTORY — DX: Hyperlipidemia, unspecified: E78.5

## 2022-12-17 MED ORDER — AMOXICILLIN-POT CLAVULANATE 875-125 MG PO TABS: 1.0000 | ORAL_TABLET | Freq: Two times a day (BID) | ORAL | 0 refills | Status: DC

## 2022-12-17 NOTE — Discharge Instructions (Signed)
Your were given a stool sample kit in urgent care today.  Please bring stool simple back to urgent care so we can send it off for testing.  Basic blood work is pending.  Will call if there are any abnormalities.  I recommend that you follow-up with your family medicine doctor as well.  Please go straight to the emergency department if symptoms persist or worsen.  Ensure adequate fluid hydration and bland diet.  I am treating you with an antibiotic for possible gum infection.  Although, please keep in mind that antibiotic can make your diarrhea worse.  Follow-up with your dentist for further evaluation and management of this.

## 2022-12-17 NOTE — ED Provider Notes (Signed)
[name]    CSN: [id number] Arrival date & time: 12/17/22  0806      History   Chief Complaint Chief Complaint  Patient presents with   Abdominal Pain    HPI Keith [name] is a 47 y.o. male.   Patient presents with abdominal pain and diarrhea that started about 6 days ago.  Patient reports he has approximately 2-3 episodes of diarrhea a day.  Denies blood in stool.  Denies nausea or vomiting.  Reports abdominal pain is generalized, is constant, and pain is rated 7/10 on the pain scale.  Patient is unable to characterize pain.  Denies any fever but does report that he had some bodyaches.  Denies dizziness or lightheadedness. Denies any known sick contacts, recent unfavorable foods, travel outside the [place].  Denies history of chronic gastrointestinal issues.  Patient also reports that he has had some left upper gum pain that started around the same time and thinks symptoms are related.  He reports that he has had some purulent drainage coming from the mouth and is draining down the back of his throat.  Reports that it started after he accidentally stabbed his gum with a fishbone.  He does not have any teeth in that area.   Abdominal Pain   Past Medical History:  Diagnosis Date   Arthritis    Back pain with sciatica 10/04/2013   left leg   Diabetes mellitus without complication (HCC)    Dyslipidemia    Heartburn     Patient Active Problem List   Diagnosis Date Noted   Lumbar radiculopathy 08/31/2018   Chronic left SI joint pain 06/04/2018   Low back pain 09/24/2017   Spinal stenosis, lumbar region, with neurogenic claudication 10/12/2013   Herniated lumbar intervertebral disc 10/12/2013   SINUSITIS, CHRONIC 11/30/2008    Past Surgical History:  Procedure Laterality Date   BACK SURGERY     lumbar x 2   CHOLECYSTECTOMY     FOOT SURGERY     HEMI-MICRODISCECTOMY LUMBAR LAMINECTOMY LEVEL 1 Left 10/12/2013   Procedure: REDO HEMI-LAMINECTOMY  MICRODISCECTOMY L5-S1 ON LEFT ;  Surgeon: Jacki Cones, MD;  Location: [facility];  Service: Orthopedics;  Laterality: Left;   KNEE SURGERY         Home Medications    Prior to Admission medications   Medication Sig Start Date End Date Taking? Authorizing Provider  amoxicillin-clavulanate (AUGMENTIN) 875-125 MG tablet Take 1 tablet by mouth every 12 (twelve) hours. 12/17/22  Yes Travaughn Vue, Rolly Salter E, FNP  atorvastatin (LIPITOR) 40 MG tablet Take 40 mg by mouth daily. 11/18/22  Yes [provider]  etodolac (LODINE) 400 MG tablet Take 1 tablet (400 mg total) by mouth 2 (two) times daily. 12/26/20   Wieters, Hallie C, PA-C  glimepiride (AMARYL) 2 MG tablet Take 1 tablet (2 mg total) by mouth every morning. 05/22/22 05/22/23  Arnaldo Natal, MD  metFORMIN (GLUCOPHAGE) 1000 MG tablet Take 1 tablet (1,000 mg total) by mouth 2 (two) times daily with a meal. 05/22/22   Arnaldo Natal, MD  tiZANidine (ZANAFLEX) 2 MG tablet Take 1-2 tablets (2-4 mg total) by mouth every 6 (six) hours as needed for muscle spasms. 12/26/20   Wieters, Junius Creamer, PA-C    Family History History reviewed. No pertinent family history.  Social History Social History   Tobacco Use   Smoking status: Every Day    Packs/day: 1.00    Years: 25.00    Additional pack years:  0.00    Total pack years: 25.00    Types: Cigarettes   Smokeless tobacco: Never  Substance Use Topics   Alcohol use: Yes    Comment: occ   Drug use: No     Allergies   Patient has no known allergies.   Review of Systems Review of Systems Per HPI  Physical Exam Triage Vital Signs ED Triage Vitals [12/17/22 0817]  Enc Vitals Group     BP (!) 129/91     Pulse Rate 84     Resp 16     Temp 97.8 F (36.6 C)     Temp Source Oral     SpO2 96 %     Weight      Height      Head Circumference      Peak Flow      Pain Score 8     Pain Loc      Pain Edu?      Excl. in GC?    No data found.  Updated Vital Signs BP (!) 129/91 (BP  Location: Right Arm)   Pulse 84   Temp 97.8 F (36.6 C) (Oral)   Resp 16   SpO2 96%   Visual Acuity Right Eye Distance:   Left Eye Distance:   Bilateral Distance:    Right Eye Near:   Left Eye Near:    Bilateral Near:     Physical Exam Constitutional:      General: He is not in acute distress.    Appearance: Normal appearance. He is not toxic-appearing or diaphoretic.  HENT:     Head: Normocephalic and atraumatic.     Mouth/Throat:      Comments: Patient is missing all upper teeth.  It appears that patient has dental hardware placed that appears metal in the upper gums.  There is very mild amount of erythema and swelling present to left upper gums. no obvious purulent drainage or visible abscess noted. Eyes:     Extraocular Movements: Extraocular movements intact.     Conjunctiva/sclera: Conjunctivae normal.  Cardiovascular:     Rate and Rhythm: Normal rate and regular rhythm.     Pulses: Normal pulses.     Heart sounds: Normal heart sounds.  Pulmonary:     Effort: Pulmonary effort is normal. No respiratory distress.     Breath sounds: Normal breath sounds.  Abdominal:     General: Bowel sounds are normal. There is no distension.     Palpations: Abdomen is soft.     Tenderness: There is abdominal tenderness.       Comments: Patient is significantly tender to palpation to upper mid abdomen.  Neurological:     General: No focal deficit present.     Mental Status: He is alert and oriented to person, place, and time. Mental status is at baseline.  Psychiatric:        Mood and Affect: Mood normal.        Behavior: Behavior normal.        Thought Content: Thought content normal.        Judgment: Judgment normal.      UC Treatments / Results  Labs (all labs ordered are listed, but only abnormal results are displayed) Labs Reviewed  CBC  COMPREHENSIVE METABOLIC PANEL    EKG   Radiology No results found.  Procedures Procedures (including critical care  time)  Medications Ordered in UC Medications - No data to display  Initial Impression / Assessment and  Plan / UC Course  I have reviewed the triage vital signs and the nursing notes.  Pertinent labs & imaging results that were available during my care of the patient were reviewed by me and considered in my medical decision making (see chart for details).     Recommended to patient that he go to the emergency department given how significantly tender to palpation he is on abdominal exam and duration of diarrhea, but patient declined going to the ER.  Risks associated with not going to ER were discussed with patient.  Patient voiced understanding and accepted risks.  Given patient is declining ER, will do limited workup here in urgent care.  CMP and CBC pending.  Patient attempted to provide stool sample here in urgent care but was not able to.  Therefore, will place future order for patient to bring stool sample back as he was provided with a stool sample kit prior to discharge.  No obvious signs of dehydration on exam that would warrant IV fluids.  Patient is tolerating food and fluids well so encouraged him to increase fluids and advised bland diet.  He was encouraged to follow-up with family medicine doctor as soon as possible for this as well and was given strict ER precautions.  No obvious significant infection of the gums noted on exam but the patient is reporting purulent drainage coming from from the gums.  Therefore, will treat with Augmentin and patient was advised to follow-up with dentist.  Educated patient that Augmentin could make diarrhea worse and he voiced understanding of this.  Patient verbalized understanding and was agreeable with plan. Final Clinical Impressions(s) / UC Diagnoses   Final diagnoses:  Diarrhea, unspecified type  Generalized abdominal pain  Pain in gums     Discharge Instructions      Your were given a stool sample kit in urgent care today.  Please bring  stool simple back to urgent care so we can send it off for testing.  Basic blood work is pending.  Will call if there are any abnormalities.  I recommend that you follow-up with your family medicine doctor as well.  Please go straight to the emergency department if symptoms persist or worsen.  Ensure adequate fluid hydration and bland diet.  I am treating you with an antibiotic for possible gum infection.  Although, please keep in mind that antibiotic can make your diarrhea worse.  Follow-up with your dentist for further evaluation and management of this.    ED Prescriptions     Medication Sig Dispense Auth. Provider   amoxicillin-clavulanate (AUGMENTIN) 875-125 MG tablet Take 1 tablet by mouth every 12 (twelve) hours. 14 tablet Laurie, Acie Fredrickson, [place]      PDMP not reviewed this encounter.   Gustavus Bryant, [place] 12/17/22 [phone]

## 2022-12-17 NOTE — ED Triage Notes (Signed)
Pt c/o abd pain, diarrhea, "something going on in one of these gums... stuff keeps leaking down my throat." Onset ~ Friday.

## 2022-12-18 LAB — CBC
Hematocrit: 45.3 % (ref 37.5–51.0)
Hemoglobin: 15.1 g/dL (ref 13.0–17.7)
MCH: 31.5 pg (ref 26.6–33.0)
MCHC: 33.3 g/dL (ref 31.5–35.7)
MCV: 94 fL (ref 79–97)
Platelets: 318 10*3/uL (ref 150–450)
RBC: 4.8 x10E6/uL (ref 4.14–5.80)
RDW: 12 % (ref 11.6–15.4)
WBC: 8.1 10*3/uL (ref 3.4–10.8)

## 2022-12-18 LAB — COMPREHENSIVE METABOLIC PANEL
ALT: 40 IU/L (ref 0–44)
AST: 21 IU/L (ref 0–40)
Albumin/Globulin Ratio: 1.5 (ref 1.2–2.2)
Albumin: 4.6 g/dL (ref 4.1–5.1)
Alkaline Phosphatase: 89 IU/L (ref 44–121)
BUN/Creatinine Ratio: 14 (ref 9–20)
BUN: 13 mg/dL (ref 6–24)
Bilirubin Total: 0.4 mg/dL (ref 0.0–1.2)
CO2: 18 mmol/L — ABNORMAL LOW (ref 20–29)
Calcium: 10 mg/dL (ref 8.7–10.2)
Chloride: 103 mmol/L (ref 96–106)
Creatinine, Ser: 0.93 mg/dL (ref 0.76–1.27)
Globulin, Total: 3 g/dL (ref 1.5–4.5)
Glucose: 217 mg/dL — ABNORMAL HIGH (ref 70–99)
Potassium: 4.9 mmol/L (ref 3.5–5.2)
Sodium: 139 mmol/L (ref 134–144)
Total Protein: 7.6 g/dL (ref 6.0–8.5)
eGFR: 102 mL/min/{1.73_m2} (ref >59)

## 2022-12-31 DIAGNOSIS — E782 Mixed hyperlipidemia: Secondary | ICD-10-CM | POA: Diagnosis not present

## 2022-12-31 DIAGNOSIS — Z1211 Encounter for screening for malignant neoplasm of colon: Secondary | ICD-10-CM | POA: Diagnosis not present

## 2022-12-31 DIAGNOSIS — F17200 Nicotine dependence, unspecified, uncomplicated: Secondary | ICD-10-CM | POA: Diagnosis not present

## 2022-12-31 DIAGNOSIS — E1165 Type 2 diabetes mellitus with hyperglycemia: Secondary | ICD-10-CM | POA: Diagnosis not present

## 2023-02-05 DIAGNOSIS — B001 Herpesviral vesicular dermatitis: Secondary | ICD-10-CM | POA: Diagnosis not present

## 2023-03-16 ENCOUNTER — Emergency Department (HOSPITAL_COMMUNITY)
Admission: EM | Admit: 2023-03-16 | Discharge: 2023-03-17 | Discharge status: Against Medical Advice | Payer: BC Managed Care – PPO | Attending: Emergency Medicine | Admitting: Emergency Medicine

## 2023-03-16 ENCOUNTER — Encounter (HOSPITAL_COMMUNITY): Payer: Self-pay

## 2023-03-16 ENCOUNTER — Other Ambulatory Visit: Payer: Self-pay

## 2023-03-16 DIAGNOSIS — I10 Essential (primary) hypertension: Secondary | ICD-10-CM | POA: Diagnosis not present

## 2023-03-16 DIAGNOSIS — E119 Type 2 diabetes mellitus without complications: Secondary | ICD-10-CM | POA: Diagnosis not present

## 2023-03-16 DIAGNOSIS — R55 Syncope and collapse: Secondary | ICD-10-CM | POA: Insufficient documentation

## 2023-03-16 DIAGNOSIS — Z5321 Procedure and treatment not carried out due to patient leaving prior to being seen by health care provider: Secondary | ICD-10-CM | POA: Insufficient documentation

## 2023-03-16 DIAGNOSIS — R531 Weakness: Secondary | ICD-10-CM | POA: Diagnosis not present

## 2023-03-16 LAB — BASIC METABOLIC PANEL
Anion gap: 10 (ref 5–15)
BUN: 20 mg/dL (ref 6–20)
CO2: 25 mmol/L (ref 22–32)
Calcium: 9.9 mg/dL (ref 8.9–10.3)
Chloride: 100 mmol/L (ref 98–111)
Creatinine, Ser: 1.34 mg/dL — ABNORMAL HIGH (ref 0.61–1.24)
GFR, Estimated: >60 mL/min (ref >60)
Glucose, Bld: 190 mg/dL — ABNORMAL HIGH (ref 70–99)
Potassium: 5.6 mmol/L — ABNORMAL HIGH (ref 3.5–5.1)
Sodium: 135 mmol/L (ref 135–145)

## 2023-03-16 LAB — CBC
HCT: 44.3 % (ref 39.0–52.0)
Hemoglobin: 14.9 g/dL (ref 13.0–17.0)
MCH: 31.3 pg (ref 26.0–34.0)
MCHC: 33.6 g/dL (ref 30.0–36.0)
MCV: 93.1 fL (ref 80.0–100.0)
Platelets: 364 10*3/uL (ref 150–400)
RBC: 4.76 MIL/uL (ref 4.22–5.81)
RDW: 12 % (ref 11.5–15.5)
WBC: 11.1 10*3/uL — ABNORMAL HIGH (ref 4.0–10.5)
nRBC: 0 % (ref 0.0–0.2)

## 2023-03-16 LAB — CBG MONITORING, ED: Glucose-Capillary: 218 mg/dL — ABNORMAL HIGH (ref 70–99)

## 2023-03-16 NOTE — ED Provider Triage Note (Signed)
Emergency Medicine Provider Triage Evaluation Note  Keith [name] , a 47 y.o. male  was evaluated in triage.  Patient states his co-workers reported he passed out at work earlier today about 12pm. Patient denies hitting head. Felt weak beforehand. Did not feel lightheaded, denies any changes in vision. He was working outside [employer] scientific [employer].   Currently still feeling weak.   Denies any heart issues that he knows of. History of T2DM  Review of Systems  Positive: As above Negative: As above  Physical Exam  BP (!) 144/108 (BP Location: Right Arm)   Pulse 94   Temp 98.1 F (36.7 C)   Resp 17   Ht 5\' 11"  (1.803 m)   Wt 128.8 kg   SpO2 99%   BMI 39.61 kg/m  Gen:   Awake, no distress   Resp:  Normal effort  MSK:   Moves extremities without difficulty  Other:  No focal neuro deficits, able to smile symetrically, tongue protrusion without deviation, no slurred speech. 5/5 strength upper and lower extremities bilaterally  Head atraumatic  Medical Decision Making  Medically screening exam initiated at 3:14 PM.  Appropriate orders placed.  Keith [name] was informed that the remainder of the evaluation will be completed by another provider, this initial triage assessment does not replace that evaluation, and the importance of remaining in the ED until their evaluation is complete.     Arabella Merles, PA-C 03/16/23 1519

## 2023-03-16 NOTE — ED Notes (Signed)
Pt called 3x with no response for room placement. Not seen in lobby

## 2023-03-16 NOTE — ED Triage Notes (Signed)
Pt BIB [employer] from work. Pt was cleaning out port-a-john. Pt had a syncopal episode and has been having generalized weakness x2 days. EMS report that pt noted some blood on stool this AM. Pt with hx of DM.  EMS Vitals  CBG 248 142/82 HR 84 SpO2 96

## 2023-03-17 DIAGNOSIS — R5383 Other fatigue: Secondary | ICD-10-CM | POA: Diagnosis not present

## 2023-03-17 DIAGNOSIS — J069 Acute upper respiratory infection, unspecified: Secondary | ICD-10-CM | POA: Diagnosis not present

## 2023-05-01 DIAGNOSIS — E782 Mixed hyperlipidemia: Secondary | ICD-10-CM | POA: Diagnosis not present

## 2023-05-01 DIAGNOSIS — F17200 Nicotine dependence, unspecified, uncomplicated: Secondary | ICD-10-CM | POA: Diagnosis not present

## 2023-05-01 DIAGNOSIS — E559 Vitamin D deficiency, unspecified: Secondary | ICD-10-CM | POA: Diagnosis not present

## 2023-05-01 DIAGNOSIS — E1165 Type 2 diabetes mellitus with hyperglycemia: Secondary | ICD-10-CM | POA: Diagnosis not present

## 2023-07-31 DIAGNOSIS — Z87891 Personal history of nicotine dependence: Secondary | ICD-10-CM | POA: Diagnosis not present

## 2023-07-31 DIAGNOSIS — E782 Mixed hyperlipidemia: Secondary | ICD-10-CM | POA: Diagnosis not present

## 2023-07-31 DIAGNOSIS — E559 Vitamin D deficiency, unspecified: Secondary | ICD-10-CM | POA: Diagnosis not present

## 2023-07-31 DIAGNOSIS — E1165 Type 2 diabetes mellitus with hyperglycemia: Secondary | ICD-10-CM | POA: Diagnosis not present

## 2023-08-03 IMAGING — DX DG CHEST 2V
2 series · 2 of 2 positions shown · non-contrast
Comparison: Radiograph 04/26/2014

CLINICAL DATA: URI.

EXAM:
CHEST - 2 VIEW

[dg chest 2 view (1 of 2)]
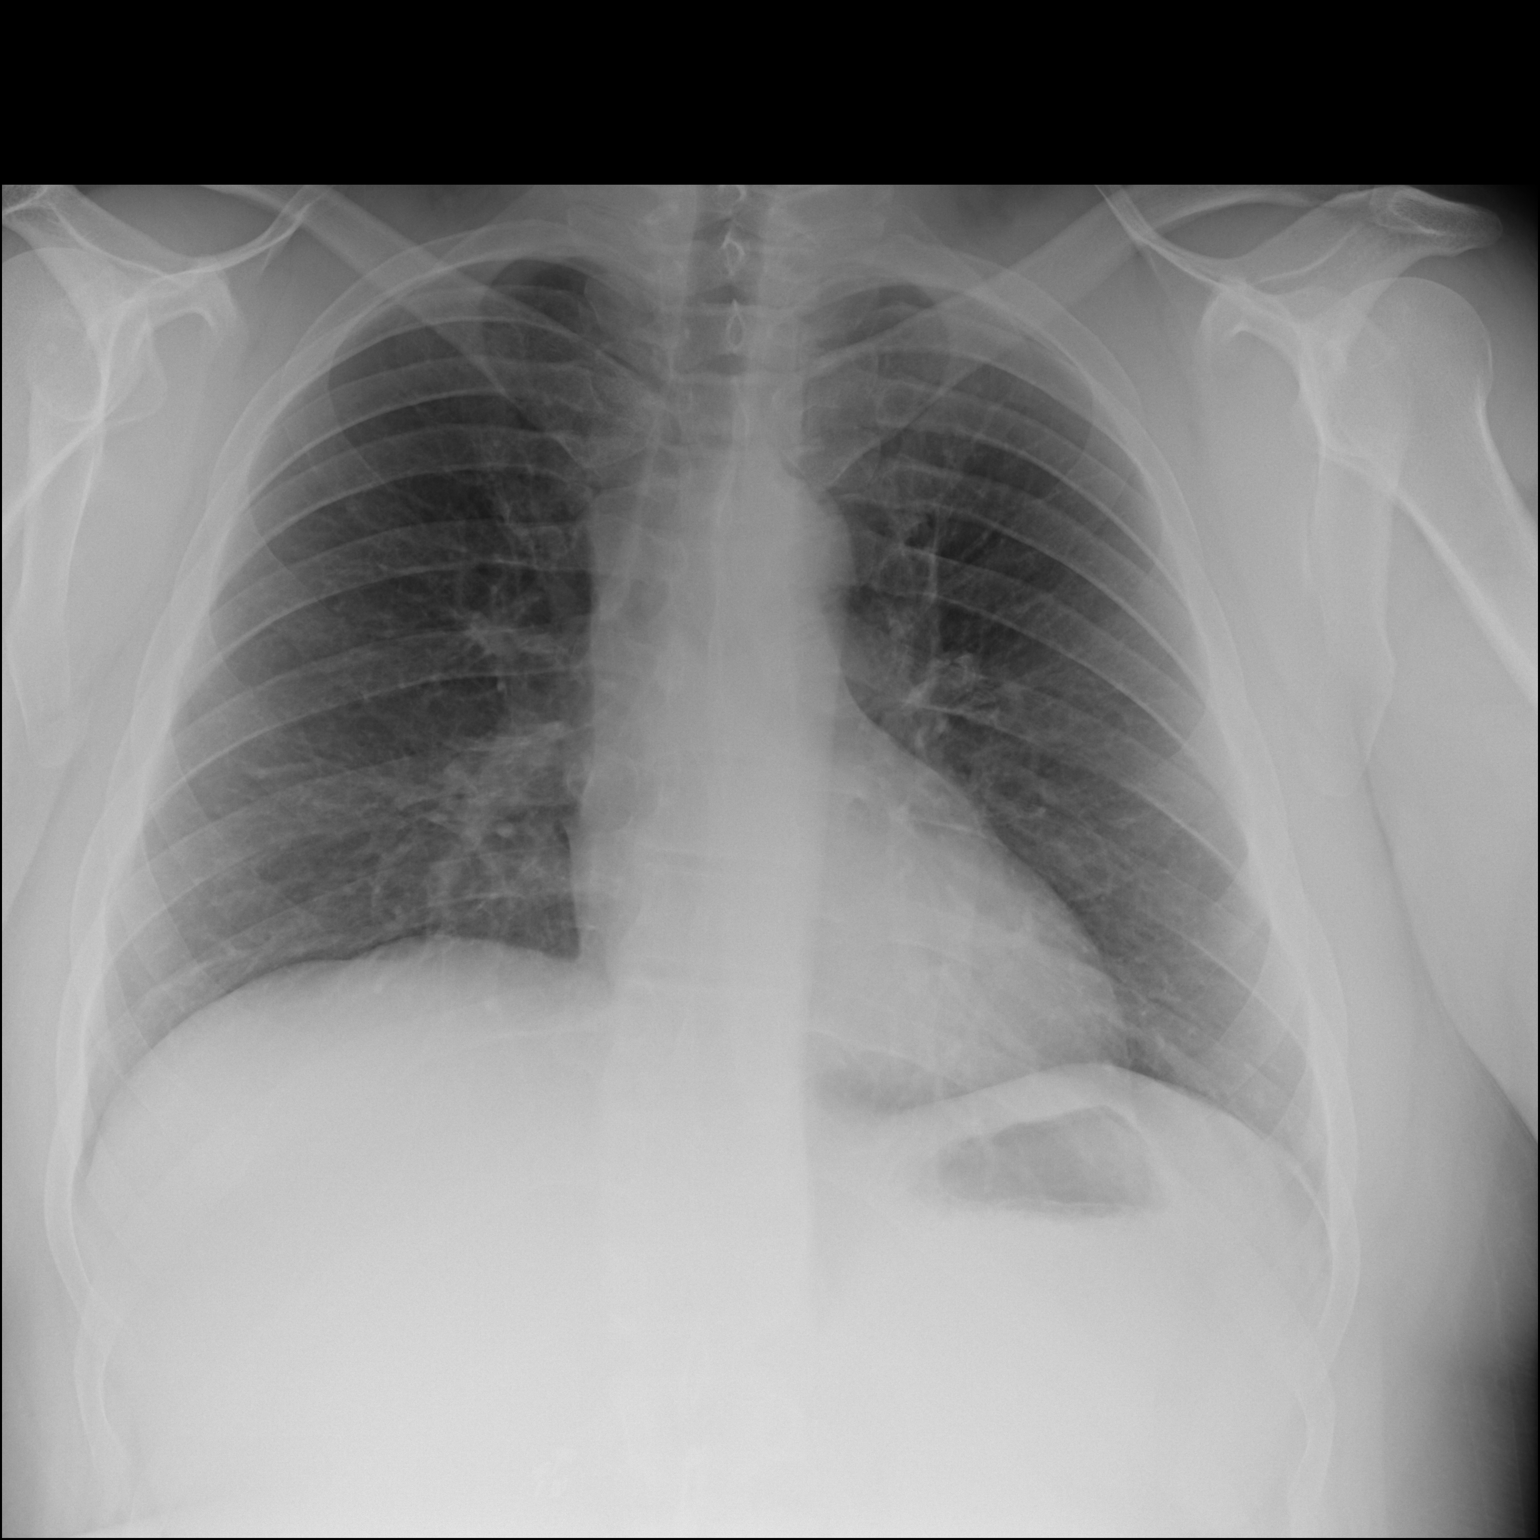

[dg chest 2 view (2 of 2)]
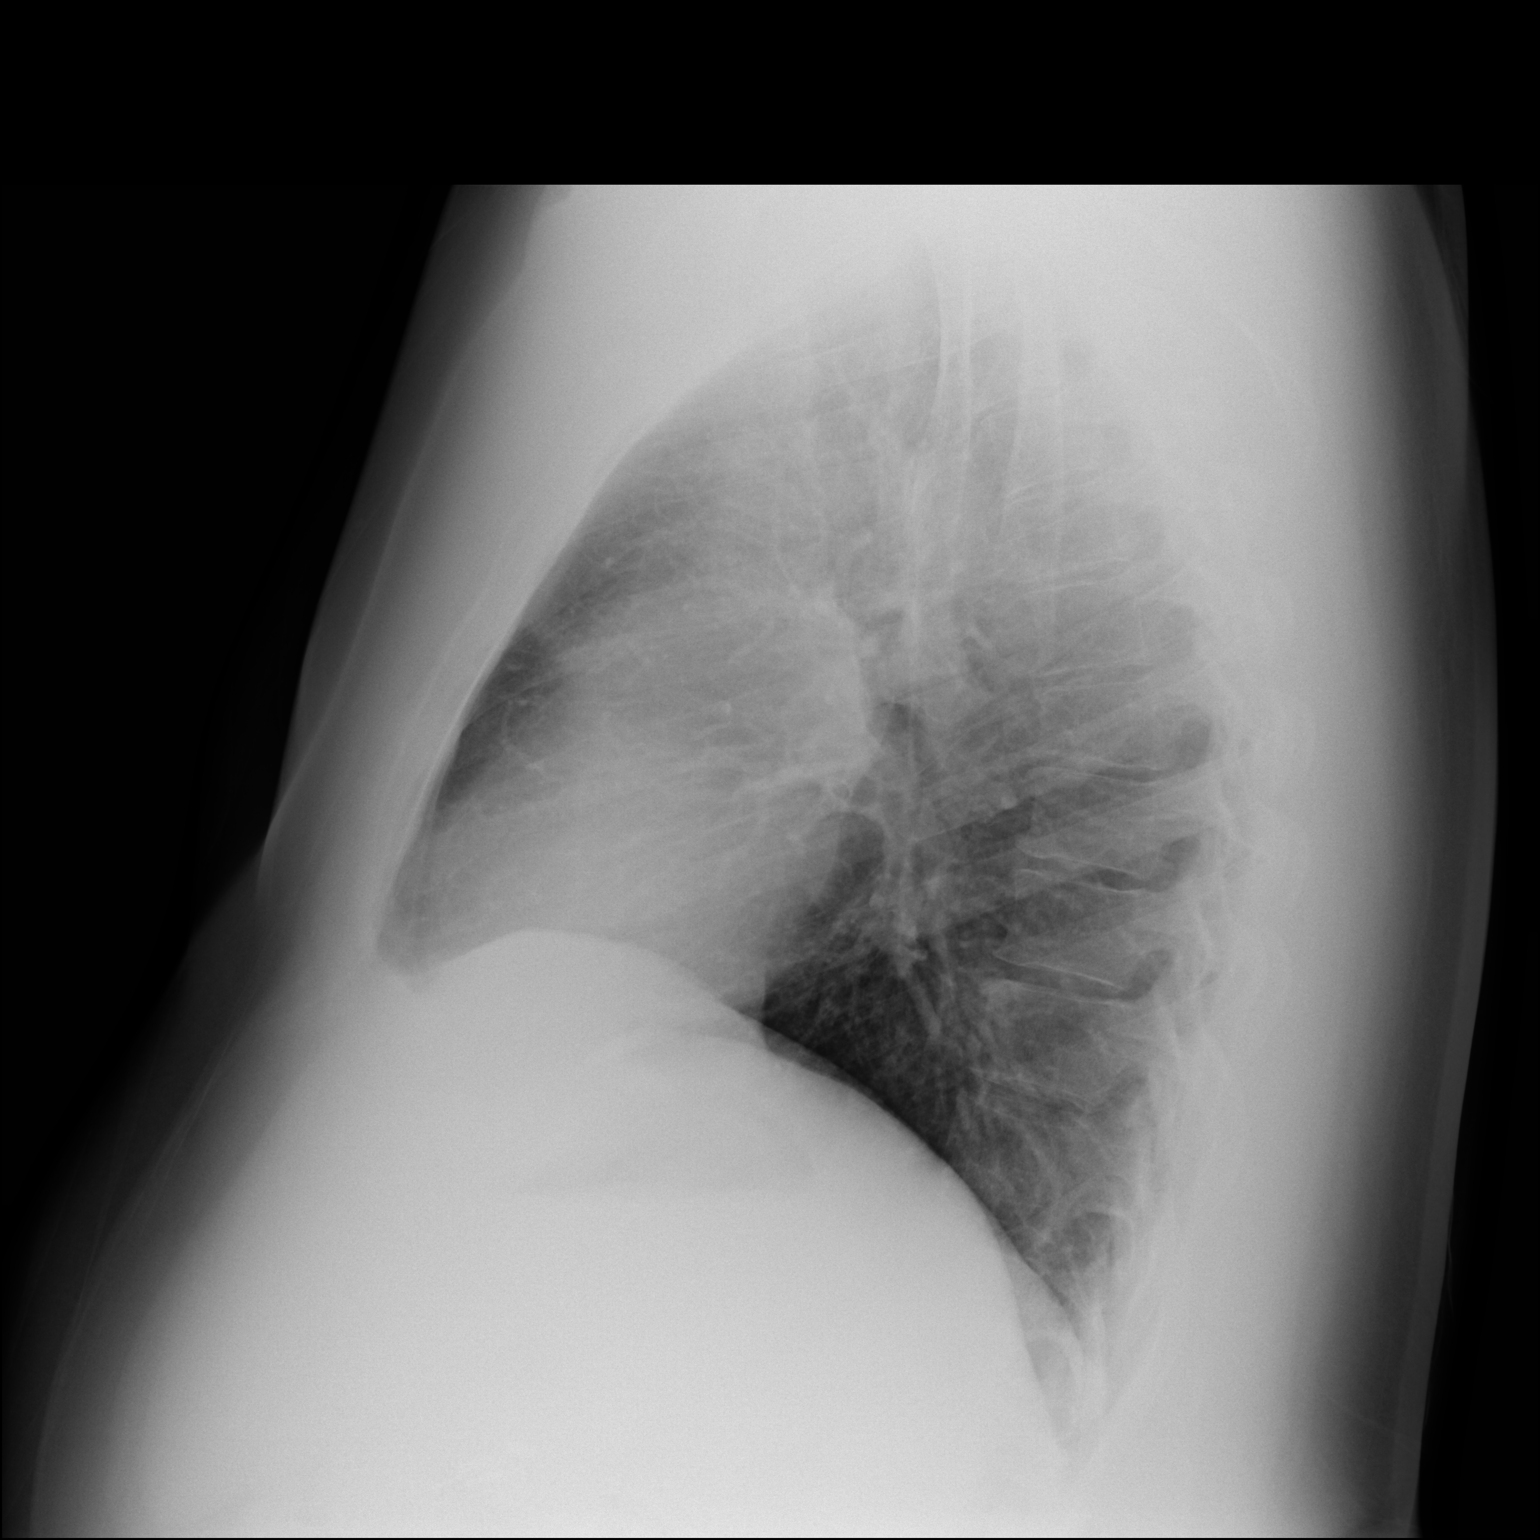

[2 of 2 positions shown; findings below may reference images not displayed]

FINDINGS: The cardiomediastinal contours are normal. The lungs are clear.
Pulmonary vasculature is normal. No consolidation, pleural effusion,
or pneumothorax. No acute osseous abnormalities are seen.
IMPRESSION: Negative radiographs of the chest.

## 2023-11-06 DIAGNOSIS — E1165 Type 2 diabetes mellitus with hyperglycemia: Secondary | ICD-10-CM | POA: Diagnosis not present

## 2023-11-06 DIAGNOSIS — E559 Vitamin D deficiency, unspecified: Secondary | ICD-10-CM | POA: Diagnosis not present

## 2023-11-06 DIAGNOSIS — Z87891 Personal history of nicotine dependence: Secondary | ICD-10-CM | POA: Diagnosis not present

## 2023-11-06 DIAGNOSIS — E782 Mixed hyperlipidemia: Secondary | ICD-10-CM | POA: Diagnosis not present

## 2023-11-17 DIAGNOSIS — H5203 Hypermetropia, bilateral: Secondary | ICD-10-CM | POA: Diagnosis not present

## 2023-11-17 DIAGNOSIS — E119 Type 2 diabetes mellitus without complications: Secondary | ICD-10-CM | POA: Diagnosis not present

## 2023-12-06 ENCOUNTER — Ambulatory Visit: Admission: EM | Admit: 2023-12-06 | Discharge: 2023-12-06 | Disposition: A | Discharge status: Home / Self Care

## 2023-12-06 DIAGNOSIS — E1169 Type 2 diabetes mellitus with other specified complication: Secondary | ICD-10-CM | POA: Diagnosis not present

## 2023-12-06 DIAGNOSIS — L247 Irritant contact dermatitis due to plants, except food: Secondary | ICD-10-CM

## 2023-12-06 MED ORDER — PREDNISONE 20 MG PO TABS: 20.0000 mg | ORAL_TABLET | Freq: Every day | ORAL | 0 refills | Status: AC

## 2023-12-06 MED ORDER — TRIAMCINOLONE ACETONIDE 0.1 % EX CREA: 1.0000 | TOPICAL_CREAM | Freq: Two times a day (BID) | CUTANEOUS | 0 refills | Status: AC

## 2023-12-06 NOTE — ED Triage Notes (Signed)
"  I have what I think is a reaction to poison ivy/oak or sumac, I did see my PCP and they gave me a topical steroid since I can't use prednisone  being a diabetic, it helped my arms but it is still persistent other places and itchy".

## 2023-12-06 NOTE — ED Provider Notes (Addendum)
 [name]    CSN: [id number] Arrival date & time: 12/06/23  1039      History   Chief Complaint Chief Complaint  Patient presents with   Rash    HPI Keith [name] is a 48 y.o. male.   Patient here today for evaluation of rash suspected from poison ivy or oak that has been present for the last several days.  He reports that he did see his PCP and was prescribed a topical ointment that was helpful in clearing some of the rash to his arms but he continues to have significant rash to his trunk and legs.  He notes significant itching.  He has not had any fever, shortness of breath or other symptoms.  He is a diabetic but reports his last A1c was under 8.  The history is provided by the patient.  Rash Associated symptoms: no fever and no shortness of breath     Past Medical History:  Diagnosis Date   Arthritis    Back pain with sciatica 10/04/2013   left leg   Diabetes mellitus without complication (HCC)    Dyslipidemia    Heartburn     Patient Active Problem List   Diagnosis Date Noted   Lumbar radiculopathy 08/31/2018   Chronic left SI joint pain 06/04/2018   Low back pain 09/24/2017   Spinal stenosis, lumbar region, with neurogenic claudication 10/12/2013   Herniated lumbar intervertebral disc 10/12/2013   SINUSITIS, CHRONIC 11/30/2008    Past Surgical History:  Procedure Laterality Date   BACK SURGERY     lumbar x 2   CHOLECYSTECTOMY     FOOT SURGERY     HEMI-MICRODISCECTOMY LUMBAR LAMINECTOMY LEVEL 1 Left 10/12/2013   Procedure: REDO HEMI-LAMINECTOMY MICRODISCECTOMY L5-S1 ON LEFT ;  Surgeon: Florencia Hunter, MD;  Location: [facility];  Service: Orthopedics;  Laterality: Left;   KNEE SURGERY         Home Medications    Prior to Admission medications   Medication Sig Start Date End Date Taking? Authorizing Provider  atorvastatin (LIPITOR) 40 MG tablet Take 40 mg by mouth daily. 11/18/22  Yes [provider]  glimepiride  (AMARYL ) 2 MG  tablet Take 1 tablet (2 mg total) by mouth every morning. 05/22/22 12/06/23 Yes Vianne Grad, MD  metFORMIN  (GLUCOPHAGE ) 1000 MG tablet Take 1 tablet (1,000 mg total) by mouth 2 (two) times daily with a meal. 05/22/22  Yes Vianne Grad, MD  OZEMPIC, 0.25 OR 0.5 MG/DOSE, 2 MG/3ML SOPN  09/05/23  Yes [provider]  predniSONE  (DELTASONE ) 20 MG tablet Take 1 tablet (20 mg total) by mouth daily with breakfast for 5 days. 12/06/23 12/11/23 Yes Vernestine Gondola, PA-C  triamcinolone  cream (KENALOG ) 0.1 % Apply 1 Application topically 2 (two) times daily. 12/06/23  Yes Vernestine Gondola, PA-C  etodolac  (LODINE ) 400 MG tablet Take 1 tablet (400 mg total) by mouth 2 (two) times daily. 12/26/20   Wieters, Hallie C, PA-C  tiZANidine  (ZANAFLEX ) 2 MG tablet Take 1-2 tablets (2-4 mg total) by mouth every 6 (six) hours as needed for muscle spasms. 12/26/20   Wieters, Hallie C, PA-C    Family History History reviewed. No pertinent family history.  Social History Social History   Tobacco Use   Smoking status: Former    Current packs/day: 1.00    Average packs/day: 1 pack/day for 25.0 years (25.0 ttl pk-yrs)    Types: Cigarettes   Smokeless tobacco: Never  Vaping Use  Vaping status: Some Days   Substances: Nicotine, Flavoring  Substance Use Topics   Alcohol use: Yes    Comment: occ   Drug use: No     Allergies   Patient has no known allergies.   Review of Systems Review of Systems  Constitutional:  Negative for chills and fever.  Eyes:  Negative for discharge and redness.  Respiratory:  Negative for shortness of breath.   Skin:  Positive for rash.  Neurological:  Negative for numbness.     Physical Exam Triage Vital Signs ED Triage Vitals  Encounter Vitals Group     BP 12/06/23 1054 126/80     Systolic BP Percentile --      Diastolic BP Percentile --      Pulse Rate 12/06/23 1054 81     Resp 12/06/23 1054 18     Temp 12/06/23 1054 98.1 F (36.7 C)     Temp Source 12/06/23  1054 Oral     SpO2 12/06/23 1054 95 %     Weight 12/06/23 1051 283 lb 15.2 oz (128.8 kg)     Height 12/06/23 1051 5\' 11"  (1.803 m)     Head Circumference --      Peak Flow --      Pain Score 12/06/23 1049 0     Pain Loc --      Pain Education --      Exclude from Growth Chart --    No data found.  Updated Vital Signs BP 126/80 (BP Location: Left Arm)   Pulse 81   Temp 98.1 F (36.7 C) (Oral)   Resp 18   Ht 5\' 11"  (1.803 m)   Wt 283 lb 15.2 oz (128.8 kg)   SpO2 95%   BMI 39.60 kg/m   Visual Acuity Right Eye Distance:   Left Eye Distance:   Bilateral Distance:    Right Eye Near:   Left Eye Near:    Bilateral Near:     Physical Exam Vitals and nursing note reviewed.  Constitutional:      General: He is not in acute distress.    Appearance: Normal appearance. He is not ill-appearing.  HENT:     Head: Normocephalic and atraumatic.  Eyes:     Conjunctiva/sclera: Conjunctivae normal.  Cardiovascular:     Rate and Rhythm: Normal rate.  Pulmonary:     Effort: Pulmonary effort is normal. No respiratory distress.  Skin:    Findings: Rash present.     Comments: Diffuse clusters of erythematous papular lesions to trunk and bilateral upper thighs  Neurological:     Mental Status: He is alert.  Psychiatric:        Mood and Affect: Mood normal.        Behavior: Behavior normal.        Thought Content: Thought content normal.      UC Treatments / Results  Labs (all labs ordered are listed, but only abnormal results are displayed) Labs Reviewed - No data to display  EKG   Radiology No results found.  Procedures Procedures (including critical care time)  Medications Ordered in UC Medications - No data to display  Initial Impression / Assessment and Plan / UC Course  I have reviewed the triage vital signs and the nursing notes.  Pertinent labs & imaging results that were available during my care of the patient were reviewed by me and considered in my  medical decision making (see chart for details).   Rash consistent  with contact dermatitis.  Given A1c was under 8 will trial low-dose prednisone  and triamcinolone  cream prescribed as he reports he has used all of the ointment.  Encouraged follow-up if no gradual improvement or with any further concerns.  Urged patient to monitor his glucose levels while taking steroid as this may elevate glucose.  Patient expressed understanding.  Final Clinical Impressions(s) / UC Diagnoses   Final diagnoses:  Irritant contact dermatitis due to plants, except food  Type 2 diabetes mellitus with other specified complication, without long-term current use of insulin  [facility])   Discharge Instructions   None    ED Prescriptions     Medication Sig Dispense Auth. Provider   triamcinolone  cream (KENALOG ) 0.1 % Apply 1 Application topically 2 (two) times daily. 60 g Jami Mcclintock F, PA-C   predniSONE  (DELTASONE ) 20 MG tablet Take 1 tablet (20 mg total) by mouth daily with breakfast for 5 days. 5 tablet Vernestine Gondola, PA-C      PDMP not reviewed this encounter.   Vernestine Gondola, PA-C 12/06/23 1129    Vernestine Gondola, PA-C 12/06/23 1130

## 2024-02-04 DIAGNOSIS — E559 Vitamin D deficiency, unspecified: Secondary | ICD-10-CM | POA: Diagnosis not present

## 2024-02-04 DIAGNOSIS — E1165 Type 2 diabetes mellitus with hyperglycemia: Secondary | ICD-10-CM | POA: Diagnosis not present

## 2024-02-04 DIAGNOSIS — E782 Mixed hyperlipidemia: Secondary | ICD-10-CM | POA: Diagnosis not present
